# Patient Record
Sex: Male | Born: 1970 | Race: White | Hispanic: No | Marital: Single | Smoking: Never smoker
Health system: Southern US, Community
[De-identification: ages and names within clinical notes are randomized; demographics above are authoritative.]

## PROBLEM LIST (undated history)

## (undated) DIAGNOSIS — F840 Autistic disorder: Secondary | ICD-10-CM

## (undated) DIAGNOSIS — F419 Anxiety disorder, unspecified: Secondary | ICD-10-CM

## (undated) DIAGNOSIS — E039 Hypothyroidism, unspecified: Secondary | ICD-10-CM

## (undated) DIAGNOSIS — G40909 Epilepsy, unspecified, not intractable, without status epilepticus: Secondary | ICD-10-CM

## (undated) DIAGNOSIS — H5210 Myopia, unspecified eye: Secondary | ICD-10-CM

## (undated) DIAGNOSIS — F79 Unspecified intellectual disabilities: Secondary | ICD-10-CM

## (undated) DIAGNOSIS — R0602 Shortness of breath: Secondary | ICD-10-CM

## (undated) DIAGNOSIS — R569 Unspecified convulsions: Secondary | ICD-10-CM

## (undated) DIAGNOSIS — L309 Dermatitis, unspecified: Secondary | ICD-10-CM

## (undated) DIAGNOSIS — K5900 Constipation, unspecified: Secondary | ICD-10-CM

---

## 1999-08-03 ENCOUNTER — Inpatient Hospital Stay (HOSPITAL_COMMUNITY): Admission: EM | Admit: 1999-08-03 | Discharge: 1999-08-06 | Payer: Self-pay

## 1999-08-05 ENCOUNTER — Encounter: Payer: Self-pay | Admitting: Internal Medicine

## 1999-08-06 ENCOUNTER — Encounter: Payer: Self-pay | Admitting: Internal Medicine

## 2000-05-20 ENCOUNTER — Emergency Department (HOSPITAL_COMMUNITY): Admission: EM | Admit: 2000-05-20 | Discharge: 2000-05-21 | Payer: Self-pay | Admitting: Emergency Medicine

## 2003-08-22 ENCOUNTER — Ambulatory Visit (HOSPITAL_COMMUNITY): Admission: RE | Admit: 2003-08-22 | Discharge: 2003-08-22 | Payer: Self-pay | Admitting: Specialist

## 2006-04-02 ENCOUNTER — Emergency Department (HOSPITAL_COMMUNITY): Admission: EM | Admit: 2006-04-02 | Discharge: 2006-04-02 | Payer: Self-pay | Admitting: Emergency Medicine

## 2007-02-27 ENCOUNTER — Ambulatory Visit (HOSPITAL_BASED_OUTPATIENT_CLINIC_OR_DEPARTMENT_OTHER): Admission: RE | Admit: 2007-02-27 | Discharge: 2007-02-27 | Payer: Self-pay | Admitting: Specialist

## 2007-03-05 ENCOUNTER — Ambulatory Visit: Payer: Self-pay | Admitting: Internal Medicine

## 2009-08-07 ENCOUNTER — Ambulatory Visit (HOSPITAL_BASED_OUTPATIENT_CLINIC_OR_DEPARTMENT_OTHER): Admission: RE | Admit: 2009-08-07 | Discharge: 2009-08-07 | Payer: Self-pay | Admitting: Specialist

## 2009-08-07 ENCOUNTER — Ambulatory Visit: Payer: Self-pay | Admitting: Diagnostic Radiology

## 2010-05-09 ENCOUNTER — Encounter: Payer: Self-pay | Admitting: Specialist

## 2010-09-01 NOTE — Procedures (Signed)
NAME:  Philip Vazquez, Philip Vazquez               ACCOUNT NO.:  [id number]   MEDICAL RECORD NO.:  [id number]          PATIENT TYPE:  OUT   LOCATION:  SLEEP CENTER                 FACILITY:  [facility]   PHYSICIAN:  Clinton D. Maple Hudson, MD, FCCP, FACPDATE OF BIRTH:  05/15/68   DATE OF STUDY:  02/27/2007                            NOCTURNAL POLYSOMNOGRAM   REFERRING PHYSICIAN:  Nicolasa Ducking, M.D.   INDICATION FOR STUDY:  Insomnia with sleep apnea.   EPWORTH SLEEPINESS SCORE:  12/24.  BMI 28.8.  Weight 184 pounds, height  67 inches.  Neck 14.5 inches.   MEDICATIONS:  Home medications are listed and reviewed.   SLEEP ARCHITECTURE:  Total sleep time 262 minutes with sleep deficiency  70%.  Stage I was 1%, stage II 69%, stage III 22%.  REM 6% of total  sleep time.  Sleep latency 17 minutes.  REM latency 71 minutes.  Awake  after sleep onset 30 seconds.  Arousal index 2.7.  No bedtime medication  was taken.   RESPIRATORY DATA:  Apnea/hypopnea index (AHI/RDI) 0.2 obstructive events  per hour which is normal (normal range 0-5 per hour).  There was only a  single hypopnea recorded with no sleep related breathing disturbance  otherwise.   OXYGEN DATA:  Mild snoring with oxygen desaturation to a nadir of 90%.  Mean oxygen saturation through the study was 94.4% on room air.   CARDIAC DATA:  Normal sinus rhythm.   MOVEMENT-PARASOMNIA:  The patient pulled on wires.  At 4:26 a.m., he was  described as very agitated and wanting to end the study.  Lights were  turned on at that point.  It appears that the patient actually was awake  from about 3 a.m.   IMPRESSIONS-RECOMMENDATIONS:  Nonspecific waking at about 3 a.m.  Unable  to return to sleep from that point.  If this is his pattern in the home  environment, then consider if he is sleeping excessively during the  daytime.  Some effort to keep him awake during the day and exposed to light and  stimulation may be contusive to sustained sleep for longer  periods of  time.  Sleep was not interrupted by movement or respiratory disturbance.      Clinton D. Maple Hudson, MD, [facility], FACP  Diplomate, Biomedical engineer of Sleep Medicine  Electronically Signed     CDY/MEDQ  D:  03/05/2007 12:33:29  T:  03/06/2007 09:33:44  Job:  [id number]

## 2010-09-04 NOTE — Discharge Summary (Signed)
[facility]  Patient:    Philip Vazquez, Philip Vazquez                      MRN: [id number] Adm. Date:  [id number] Disc. Date: [id number] Attending:  Auburn Bilberry Courson                           Discharge Summary  DATE OF BIRTH:  08-20-1970.  DISCHARGE DIAGNOSIS:  Multiple seizures.  DISCHARGE CONDITION:  Improved.  HISTORY OF PRESENT ILLNESS:  Patient is a 40 year old male with severe mental retardation and seizure disorder who was brought to the emergency room on the 16th because of multiple seizures that day; he had had five seizures at the group home and had several more after arrival in the emergency room.  This patient had apparently been feeling tired prior to the onset of this, but had no other recent illnesses or complaint.  Following a seizure in the emergency room, the patient was very agitated but calmed down with Ativan.  HOSPITAL COURSE:  The patient was loaded with Dilantin in the emergency room and had no further seizures during his hospital stay.  When he woke up enough to begin taking oral medications, again he was placed back on Tegretol, which had been his anticonvulsant prior to admission.  The day following admission, the patient was sleepy but calm without agitation and had not quite woken back up to his baseline.  He was noted to have quite an elevated white count and a low-grade fever and medicine was asked to see the patient to rule out infectious process.  His white count and his fevers steadily came down over the next two days.  No definite pneumonia or other cause for the infection were determined.  It was recommended by medicine that he take Augmentin for 10 days for potential unvisualized pneumonia.  Tegretol level obtained in the emergency room did show that he had a subtherapeutic level and apparently he had missed several doses of Tegretol at his group home in the days prior to the seizures. On the 19th, patient was  discharged back to his group home.  DISCHARGE MEDICATIONS: 1. Tegretol 600 mg in the morning, 400 mg at noon and 600 mg in the evening. 2. Clonazepam 1 mg three times daily. 3. Augmentin 500 mg three times daily for 10 days.  CONDITION ON DISCHARGE:  Patients condition on discharge was stable and back to his usual baseline.  Other than the fever, he had no complications during the hospital stay. DD:  09/04/99 TD:  09/08/99 Job: [id number]

## 2011-04-09 ENCOUNTER — Emergency Department (HOSPITAL_BASED_OUTPATIENT_CLINIC_OR_DEPARTMENT_OTHER)
Admission: EM | Admit: 2011-04-09 | Discharge: 2011-04-09 | Disposition: A | Discharge status: Home / Self Care | Payer: Medicare Other | Attending: Emergency Medicine | Admitting: Emergency Medicine

## 2011-04-09 DIAGNOSIS — IMO0002 Reserved for concepts with insufficient information to code with codable children: Secondary | ICD-10-CM | POA: Insufficient documentation

## 2011-04-09 DIAGNOSIS — T17308A Unspecified foreign body in larynx causing other injury, initial encounter: Secondary | ICD-10-CM | POA: Insufficient documentation

## 2011-04-09 DIAGNOSIS — Y9210 Unspecified residential institution as the place of occurrence of the external cause: Secondary | ICD-10-CM | POA: Insufficient documentation

## 2011-04-09 HISTORY — DX: Unspecified convulsions: R56.9

## 2011-04-09 HISTORY — DX: Unspecified intellectual disabilities: F79

## 2011-04-09 NOTE — ED Notes (Signed)
Caregiver reports pt was at vocational center today, ate a bite of cake and choked.  No obvious airway compromise at present.

## 2011-04-09 NOTE — ED Provider Notes (Signed)
History     CSN: [id number]  Arrival date & time 04/09/11  1405   First MD Initiated Contact with Patient 04/09/11 1501      Chief Complaint  Patient presents with  . Choking    (Consider location/radiation/quality/duration/timing/severity/associated sxs/prior treatment) History provided by: The caregiver. History Limited By: Mental retardation.   patient has a history of MR and was at the vocational training Center today when he was left alone with a large amount of taken front of him which he began to eat vigorously and reported began to choke.  As reported that a Heimlich maneuver was attempted with some regurgitation of cake.  The patient never turned colors became cyanotic.  He is without complaints now.  This event occurred approximately 2 and half hours ago.  He was brought to the emergency department per policy for evaluation  Past Medical History  Diagnosis Date  . Mental retardation   . Seizure     History reviewed. No pertinent past surgical history.  No family history on file.  History  Substance Use Topics  . Smoking status: Never Smoker   . Smokeless tobacco: Never Used  . Alcohol Use: No      Review of Systems  Unable to perform ROS   Allergies  Review of patient's allergies indicates not on file.  Home Medications   Current Outpatient Rx  Name Route Sig Dispense Refill  . CARBAMAZEPINE ER 400 MG PO TB12 Oral Take 400 mg by mouth 3 (three) times daily.      Marland Kitchen CLONAZEPAM 1 MG PO TABS Oral Take 1 mg by mouth 4 (four) times daily.      Marland Kitchen GABAPENTIN 600 MG PO TABS Oral Take 600 mg by mouth 3 (three) times daily.      Marland Kitchen LACTULOSE 10 GM/15ML PO SOLN Oral Take 45 mLs by mouth 2 (two) times daily.      Marland Kitchen LORAZEPAM 2 MG PO TABS Oral Take 2 mg by mouth as needed.        BP 110/69  Pulse 82  Resp 18  Wt 192 lb (87.091 kg)  SpO2 96%  Physical Exam  Nursing note and vitals reviewed. Constitutional: He is oriented to person, place, and time. He appears  well-developed and well-nourished.  HENT:  Head: Normocephalic and atraumatic.  Eyes: EOM are normal.  Neck: Neck supple.  Cardiovascular: Normal rate and regular rhythm.   Pulmonary/Chest: Effort normal and breath sounds normal. No respiratory distress. He has no wheezes. He has no rales.  Abdominal: Soft.  Musculoskeletal: Normal range of motion.  Neurological: He is alert and oriented to person, place, and time.  Skin: Skin is warm and dry.    ED Course  Procedures (including critical care time)  Labs Reviewed - No data to display No results found.   1. Choking       MDM  The patient had no color change.  His O2 saturations are 96% on room air.  He has no increased work of breathing.  There is no indication for x-ray.  He's been discharged home with strict instructions to return for fever greater than 101 or worsening breathing or development of persistent cough or with changes in mental status.        Lyanne Co, MD 04/09/11 [phone]

## 2012-10-28 ENCOUNTER — Encounter (HOSPITAL_COMMUNITY): Payer: Self-pay | Admitting: *Deleted

## 2012-10-28 ENCOUNTER — Emergency Department (INDEPENDENT_AMBULATORY_CARE_PROVIDER_SITE_OTHER)
Admission: EM | Admit: 2012-10-28 | Discharge: 2012-10-28 | Disposition: A | Discharge status: Home / Self Care | Payer: Medicare Other | Source: Home / Self Care | Attending: Family Medicine | Admitting: Family Medicine

## 2012-10-28 DIAGNOSIS — S90129A Contusion of unspecified lesser toe(s) without damage to nail, initial encounter: Secondary | ICD-10-CM

## 2012-10-28 DIAGNOSIS — S90229A Contusion of unspecified lesser toe(s) with damage to nail, initial encounter: Secondary | ICD-10-CM

## 2012-10-28 HISTORY — DX: Epilepsy, unspecified, not intractable, without status epilepticus: G40.909

## 2012-10-28 HISTORY — DX: Autistic disorder: F84.0

## 2012-10-28 NOTE — ED Provider Notes (Signed)
History    CSN: [id number] Arrival date & time 10/28/12  1107  First MD Initiated Contact with Patient 10/28/12 1121     Chief Complaint  Patient presents with  . Nail Problem   (Consider location/radiation/quality/duration/timing/severity/associated sxs/prior Treatment) HPI Comments: 42 year old male non diawith history of mental retardation and autism disorder who lives in a group home. Was noticed to have discolored right big toenail first noticed yesterday afternoon. No known injury. Otherwise patient has shown usual behavior without changes, baseline energy level and appetite. No limping.  Past Medical History  Diagnosis Date  . Mental retardation   . Autism   . Seizure   . Seizure disorder    History reviewed. No pertinent past surgical history. No family history on file. History  Substance Use Topics  . Smoking status: Never Smoker   . Smokeless tobacco: Never Used  . Alcohol Use: No    Review of Systems  Constitutional: Negative for fever, chills, activity change and appetite change.  Skin:       Toe nail discoloration as per HPI  Psychiatric/Behavioral:       No changes in behavior from base line    Allergies  Bee venom  Home Medications   Current Outpatient Rx  Name  Route  Sig  Dispense  Refill  . carbamazepine (TEGRETOL XR) 400 MG 12 hr tablet   Oral   Take 400 mg by mouth 3 (three) times daily.           . Chloral Hydrate (SOMNOTE PO)   Oral   Take by mouth.         . clonazePAM (KLONOPIN) 1 MG tablet   Oral   Take 0.5 mg by mouth 4 (four) times daily.          Marland Kitchen erythromycin (ERYTHROCIN) 250 MG tablet   Oral   Take 250 mg by mouth 2 (two) times daily.         Marland Kitchen gabapentin (NEURONTIN) 600 MG tablet   Oral   Take 600 mg by mouth 3 (three) times daily.           Marland Kitchen lactulose (CHRONULAC) 10 GM/15ML solution   Oral   Take 45 mLs by mouth 2 (two) times daily.           Marland Kitchen oral electrolytes (THERMOTABS) TABS   Oral   Take by  mouth.         Marland Kitchen LORazepam (ATIVAN) 2 MG tablet   Oral   Take 2 mg by mouth as needed.            Pulse 90  SpO2 97% Physical Exam  Nursing note and vitals reviewed. Constitutional: He is oriented to person, place, and time. He appears well-developed and well-nourished. No distress.  Singing, alert, speaks loud, at base line behavior as per care taker, no obvious distress.   HENT:  Head: Normocephalic and atraumatic.  Cardiovascular: Normal heart sounds.   Pulmonary/Chest: Breath sounds normal.  Musculoskeletal: He exhibits no edema and no tenderness.  No bruising, skin cuts or deformity of the first proximal phalange of toes.   Neurological: He is alert and oriented to person, place, and time.  Skin: He is not diaphoretic.  Right toe nail: long not trimmed toe nails. there is a less than 40% subungual hematoma mostly from the lateral half of the toe nail appears partially drained as there is dry blood in the tip of the toe below the nail laterally. No avulsion.  No paronychia.  (similar milder changes in left toenail) Patient is wearing narrow closed toe shoes here that obviously have friction with long big toenails.     ED Course  Procedures (including critical care time) Labs Reviewed - No data to display No results found. 1. Subungual hematoma of toenail, unspecified laterality, initial encounter     MDM  Small subungueal hematoma of the right toenail, milder similar changes in left toenail. On the Right toenail has been self draining as there is blood in the tip of toe under the nail.  Reccommended foot soak to complete draining and instructed to let dry feet well and use open toes shoes or sandals. Needs toe nail trimming by a podiatrist, referal provided.   Was placed on bilateral post op open toe shoes.  Supportive care and red flags that should prompt return to medical attention discussed with care taker and provided in writing.   Sharin Grave, MD 10/30/12  [phone]

## 2012-10-28 NOTE — ED Notes (Signed)
Per caregiver, report at group home stated they noticed purplish discoloration to right great toe nailbed, and small spot to left great toe nailbed yesterday afternoon.  No known injury.  Pt walking normal per caregiver.  Unable to obtain blood pressure and temperature due to pt behavior.

## 2012-11-17 ENCOUNTER — Ambulatory Visit (INDEPENDENT_AMBULATORY_CARE_PROVIDER_SITE_OTHER): Payer: Medicare Other | Admitting: Podiatry

## 2012-11-17 DIAGNOSIS — L608 Other nail disorders: Secondary | ICD-10-CM

## 2012-11-17 NOTE — Progress Notes (Signed)
Presents with blackend toe nail on right. Patient is non-verbal patient. Came in with a care taker. Care taker noticed a black toe nail on right a week ago. Patient did not express any pain from the toe.  Objective: Blackened toe nail from old injury right great toe. No edema or erythema noted. Rectus foot without gross deformity. All pedal pulses are palpable.  Assessment: Old nail injury with blackened right great toe nail without soft tissue inflammation or infection.  Plan:  Debrided discolored nail. Return as needed.

## 2012-11-28 DIAGNOSIS — L608 Other nail disorders: Secondary | ICD-10-CM | POA: Insufficient documentation

## 2013-02-03 ENCOUNTER — Emergency Department (HOSPITAL_COMMUNITY): Payer: Medicare Other

## 2013-02-03 ENCOUNTER — Emergency Department (HOSPITAL_COMMUNITY)
Admission: EM | Admit: 2013-02-03 | Discharge: 2013-02-03 | Disposition: A | Discharge status: Home / Self Care | Payer: Medicare Other | Attending: Emergency Medicine | Admitting: Emergency Medicine

## 2013-02-03 ENCOUNTER — Encounter (HOSPITAL_COMMUNITY): Payer: Self-pay | Admitting: Emergency Medicine

## 2013-02-03 DIAGNOSIS — G40909 Epilepsy, unspecified, not intractable, without status epilepticus: Secondary | ICD-10-CM | POA: Insufficient documentation

## 2013-02-03 DIAGNOSIS — Z79899 Other long term (current) drug therapy: Secondary | ICD-10-CM | POA: Insufficient documentation

## 2013-02-03 DIAGNOSIS — R569 Unspecified convulsions: Secondary | ICD-10-CM

## 2013-02-03 DIAGNOSIS — F840 Autistic disorder: Secondary | ICD-10-CM | POA: Insufficient documentation

## 2013-02-03 LAB — URINALYSIS, ROUTINE W REFLEX MICROSCOPIC
Bilirubin Urine: NEGATIVE
Ketones, ur: 15 mg/dL — AB
Nitrite: NEGATIVE
Protein, ur: 30 mg/dL — AB
Urobilinogen, UA: 0.2 mg/dL (ref 0.0–1.0)

## 2013-02-03 LAB — BASIC METABOLIC PANEL
BUN: 8 mg/dL (ref 6–23)
Chloride: 97 mEq/L (ref 96–112)
Creatinine, Ser: 0.81 mg/dL (ref 0.50–1.35)
Glucose, Bld: 137 mg/dL — ABNORMAL HIGH (ref 70–99)
Potassium: 3.8 mEq/L (ref 3.5–5.1)

## 2013-02-03 LAB — RAPID URINE DRUG SCREEN, HOSP PERFORMED
Barbiturates: NOT DETECTED
Benzodiazepines: POSITIVE — AB
Cocaine: NOT DETECTED
Tetrahydrocannabinol: NOT DETECTED

## 2013-02-03 LAB — CBC
HCT: 43.8 % (ref 39.0–52.0)
Hemoglobin: 15.8 g/dL (ref 13.0–17.0)
MCHC: 36.1 g/dL — ABNORMAL HIGH (ref 30.0–36.0)
MCV: 86.7 fL (ref 78.0–100.0)
WBC: 14 10*3/uL — ABNORMAL HIGH (ref 4.0–10.5)

## 2013-02-03 LAB — URINE MICROSCOPIC-ADD ON

## 2013-02-03 MED ORDER — LORAZEPAM 2 MG/ML IJ SOLN
2.0000 mg | Freq: Once | INTRAMUSCULAR | Status: AC
  Administered 2013-02-03: 2 mg via INTRAVENOUS
  Filled 2013-02-03: qty 1

## 2013-02-03 MED ORDER — HALOPERIDOL LACTATE 5 MG/ML IJ SOLN
5.0000 mg | Freq: Once | INTRAMUSCULAR | Status: AC
  Administered 2013-02-03: 5 mg via INTRAVENOUS
  Filled 2013-02-03: qty 1

## 2013-02-03 MED ORDER — SODIUM CHLORIDE 0.9 % IV BOLUS (SEPSIS)
1000.0000 mL | Freq: Once | INTRAVENOUS | Status: AC
  Administered 2013-02-03: 1000 mL via INTRAVENOUS

## 2013-02-03 MED ORDER — GABAPENTIN 800 MG PO TABS: 800.0000 mg | ORAL_TABLET | Freq: Three times a day (TID) | ORAL | Status: AC

## 2013-02-03 MED ORDER — LORAZEPAM 2 MG/ML IJ SOLN
INTRAMUSCULAR | Status: AC
  Administered 2013-02-03: 2 mg
  Filled 2013-02-03: qty 1

## 2013-02-03 NOTE — ED Notes (Signed)
Presents from Group home, with seizure tonic clonic, mentally handicapped, given 10 mg versed, pt combative, non verbal. Tachycardic, diaphoretic, pale.

## 2013-02-03 NOTE — ED Provider Notes (Signed)
CSN: [id number]     Arrival date & time 02/03/13  1713 History   First MD Initiated Contact with Patient 02/03/13 1714     Chief Complaint  Patient presents with  . Seizures   (Consider location/radiation/quality/duration/timing/severity/associated sxs/prior Treatment) Patient is a 42 y.o. male presenting with seizures. The history is provided by the patient.  Seizures Seizure activity on arrival: no   Seizure type:  Grand mal Preceding symptoms: no sensation of an aura present   Initial focality:  None Episode characteristics: combativeness and generalized shaking   Episode characteristics: no confusion and no disorientation   Postictal symptoms: no confusion   Return to baseline: yes   Severity:  Moderate Duration:  2 minutes Timing:  Clustered Number of seizures this episode:  3 today Progression:  Worsening Context: not change in medication, not sleeping less, not drug use, medical compliance and not previous head injury   Recent head injury:  No recent head injuries PTA treatment:  Diazepam History of seizures: yes     Past Medical History  Diagnosis Date  . Mental retardation   . Autism   . Seizure   . Seizure disorder    History reviewed. No pertinent past surgical history. History reviewed. No pertinent family history. History  Substance Use Topics  . Smoking status: Never Smoker   . Smokeless tobacco: Never Used  . Alcohol Use: No    Review of Systems  Constitutional: Negative for fever.  Respiratory: Negative for cough and shortness of breath.   Gastrointestinal: Negative for vomiting and abdominal pain.  Neurological: Positive for seizures.  All other systems reviewed and are negative.    Allergies  Bee venom  Home Medications   Current Outpatient Rx  Name  Route  Sig  Dispense  Refill  . carbamazepine (TEGRETOL XR) 400 MG 12 hr tablet   Oral   Take 400 mg by mouth 3 (three) times daily.           . Chloral Hydrate (SOMNOTE PO)   Oral    Take 1 tablet by mouth daily.          . clonazePAM (KLONOPIN) 1 MG tablet   Oral   Take 0.5 mg by mouth 4 (four) times daily.          Marland Kitchen lactulose (CHRONULAC) 10 GM/15ML solution   Oral   Take 45 mLs by mouth 2 (two) times daily.           Marland Kitchen LORazepam (ATIVAN) 2 MG tablet   Oral   Take 2 mg by mouth every 6 (six) hours as needed for anxiety.          . gabapentin (NEURONTIN) 800 MG tablet   Oral   Take 1 tablet (800 mg total) by mouth 3 (three) times daily.   90 tablet   0    BP 124/79  Pulse 90  Temp(Src) 98.5 F (36.9 C) (Oral)  Resp 18  SpO2 95% Physical Exam  Nursing note and vitals reviewed. Constitutional: He appears well-developed and well-nourished. No distress.  HENT:  Head: Normocephalic and atraumatic.  Mouth/Throat: No oropharyngeal exudate.  Eyes: EOM are normal. Pupils are equal, round, and reactive to light.  Neck: Normal range of motion. Neck supple.  Cardiovascular: Normal rate and regular rhythm.  Exam reveals no friction rub.   No murmur heard. Pulmonary/Chest: Effort normal and breath sounds normal. No respiratory distress. He has no wheezes. He has no rales.  Abdominal: He exhibits no distension.  There is no tenderness. There is no rebound.  Musculoskeletal: Normal range of motion. He exhibits no edema.  Neurological: He is alert. No cranial nerve deficit. He exhibits normal muscle tone. Coordination normal.  Skin: He is not diaphoretic.    ED Course  Procedures (including critical care time) Labs Review Labs Reviewed  CBC - Abnormal; Notable for the following:    WBC 14.0 (*)    MCHC 36.1 (*)    All other components within normal limits  BASIC METABOLIC PANEL - Abnormal; Notable for the following:    Glucose, Bld 137 (*)    All other components within normal limits  URINE RAPID DRUG SCREEN (HOSP PERFORMED) - Abnormal; Notable for the following:    Benzodiazepines POSITIVE (*)    All other components within normal limits   URINALYSIS, ROUTINE W REFLEX MICROSCOPIC - Abnormal; Notable for the following:    APPearance CLOUDY (*)    Hgb urine dipstick SMALL (*)    Ketones, ur 15 (*)    Protein, ur 30 (*)    All other components within normal limits  CARBAMAZEPINE LEVEL, TOTAL  URINE MICROSCOPIC-ADD ON   Imaging Review Ct Head Wo Contrast  02/03/2013   CLINICAL DATA:  Seizure  EXAM: CT HEAD WITHOUT CONTRAST  TECHNIQUE: Contiguous axial images were obtained from the base of the skull through the vertex without intravenous contrast.  COMPARISON:  None.  FINDINGS: No mass effect, midline shift, or acute intracranial hemorrhage. Brain parenchyma and ventricular system are within normal limits. Mastoid air cells are clear. Visualized paranasal sinuses are clear. Cranium is intact.  IMPRESSION: No acute intracranial pathology.   Electronically Signed   By: Maryclare Bean M.D.   On: 02/03/2013 19:34   Dg Chest Port 1 View  02/03/2013   CLINICAL DATA:  Agitation  EXAM: PORTABLE CHEST - 1 VIEW  COMPARISON:  None.  FINDINGS: Low lung volumes. Bibasilar linear atelectasis. Normal heart size. No pneumothorax.  IMPRESSION: Bibasilar atelectasis   Electronically Signed   By: Maryclare Bean M.D.   On: 02/03/2013 19:04    EKG Interpretation   None       MDM   1. Seizure    42M nonverbal, hx of seizures. Had 3 seizures today, one yesterday. Has allegedly not had seizures since July. Patient lives at [facility] group home. Per staff there, no missed medications. Combative with EMS, had normal sugar, required full restraints for transport. Upon arrival, combative, trying to get off the bed, talking loudly. Given ativan and haldol. Received 10 IM of Versed en route.  Moving all extremities, no signs of trauma, will check labs.  Labs show mild leukocytosis, otherwise normal. CT Head normal. No signs of infection. I spoke with Neuro who recommended increase in gabapentin. Patient relaxing, sleeping comfortably here. Stable for  discharge, instructed patient's caregiver to f/u in 2 days with PCP.     Dagmar Hait, MD 02/03/13 2009

## 2013-02-28 ENCOUNTER — Other Ambulatory Visit: Payer: Self-pay | Admitting: Dentistry

## 2013-03-01 ENCOUNTER — Encounter (HOSPITAL_COMMUNITY)
Admission: RE | Admit: 2013-03-01 | Discharge: 2013-03-01 | Disposition: A | Discharge status: Home / Self Care | Payer: Medicare Other | Source: Ambulatory Visit | Attending: Dentistry | Admitting: Dentistry

## 2013-03-01 ENCOUNTER — Encounter (HOSPITAL_COMMUNITY): Payer: Self-pay

## 2013-03-01 HISTORY — DX: Dermatitis, unspecified: L30.9

## 2013-03-01 HISTORY — DX: Myopia, unspecified eye: H52.10

## 2013-03-01 HISTORY — DX: Shortness of breath: R06.02

## 2013-03-01 LAB — BASIC METABOLIC PANEL
CO2: 25 mEq/L (ref 19–32)
Calcium: 9 mg/dL (ref 8.4–10.5)
Creatinine, Ser: 0.7 mg/dL (ref 0.50–1.35)
GFR calc Af Amer: >90 mL/min (ref >90)
GFR calc non Af Amer: >90 mL/min (ref >90)
Potassium: 3.7 mEq/L (ref 3.5–5.1)
Sodium: 132 mEq/L — ABNORMAL LOW (ref 135–145)

## 2013-03-01 LAB — CBC
Hemoglobin: 15.5 g/dL (ref 13.0–17.0)
MCV: 86.7 fL (ref 78.0–100.0)
Platelets: 428 10*3/uL — ABNORMAL HIGH (ref 150–400)
RBC: 5.02 MIL/uL (ref 4.22–5.81)
WBC: 11.6 10*3/uL — ABNORMAL HIGH (ref 4.0–10.5)

## 2013-03-01 NOTE — Pre-Procedure Instructions (Signed)
Philip Vazquez  03/01/2013   Your procedure is scheduled on:  03/02/13  Report to [facility]  2 * 3 at 830 AM.  Call this number if you have problems the morning of surgery: [phone]   Remember:   Do not eat food or drink liquids after midnight.   Take these medicines the morning of surgery with A SIP OF WATER: TEGRETOL,SOMNOTE,KLONOPIN,NEURONTIN,ATIVAN   Do not wear jewelry, make-up or nail polish.  Do not wear lotions, powders, or perfumes. You may wear deodorant.  Do not shave 48 hours prior to surgery. Men may shave face and neck.  Do not bring valuables to the hospital.  [facility] is not responsible                  for any belongings or valuables.               Contacts, dentures or bridgework may not be worn into surgery.  Leave suitcase in the car. After surgery it may be brought to your room.  For patients admitted to the hospital, discharge time is determined by your                treatment team.               Patients discharged the day of surgery will not be allowed to drive  home.  Name and phone number of your driver: FAMILY  Special Instructions: Shower using CHG 2 nights before surgery and the night before surgery.  If you shower the day of surgery use CHG.  Use special wash - you have one bottle of CHG for all showers.  You should use approximately 1/3 of the bottle for each shower.   Please read over the following fact sheets that you were given: Pain Booklet, Coughing and Deep Breathing and Surgical Site Infection Prevention

## 2013-03-02 ENCOUNTER — Ambulatory Visit (HOSPITAL_COMMUNITY): Payer: Medicare Other | Admitting: Anesthesiology

## 2013-03-02 ENCOUNTER — Encounter (HOSPITAL_COMMUNITY)
Admission: RE | Disposition: A | Discharge status: Home / Self Care | Payer: Self-pay | Source: Ambulatory Visit | Attending: Dentistry

## 2013-03-02 ENCOUNTER — Encounter (HOSPITAL_COMMUNITY): Payer: Self-pay | Admitting: *Deleted

## 2013-03-02 ENCOUNTER — Ambulatory Visit (HOSPITAL_COMMUNITY)
Admission: RE | Admit: 2013-03-02 | Discharge: 2013-03-02 | Disposition: A | Discharge status: Home / Self Care | Payer: Medicare Other | Source: Ambulatory Visit | Attending: Dentistry | Admitting: Dentistry

## 2013-03-02 ENCOUNTER — Encounter (HOSPITAL_COMMUNITY): Payer: Medicare Other | Admitting: Anesthesiology

## 2013-03-02 DIAGNOSIS — F919 Conduct disorder, unspecified: Secondary | ICD-10-CM | POA: Insufficient documentation

## 2013-03-02 DIAGNOSIS — K029 Dental caries, unspecified: Secondary | ICD-10-CM | POA: Insufficient documentation

## 2013-03-02 DIAGNOSIS — F79 Unspecified intellectual disabilities: Secondary | ICD-10-CM | POA: Insufficient documentation

## 2013-03-02 HISTORY — PX: DENTAL RESTORATION/EXTRACTION WITH X-RAY: SHX5796

## 2013-03-02 SURGERY — DENTAL RESTORATION/EXTRACTION WITH X-RAY
Anesthesia: General | Site: Mouth | Wound class: Clean Contaminated

## 2013-03-02 MED ORDER — CEFAZOLIN SODIUM-DEXTROSE 2-3 GM-% IV SOLR
1.0000 g | Freq: Once | INTRAVENOUS | Status: AC
  Administered 2013-03-02: 1 g via INTRAVENOUS

## 2013-03-02 MED ORDER — KETAMINE HCL 10 MG/ML IJ SOLN
INTRAMUSCULAR | Status: DC | PRN
  Administered 2013-03-02: 400 mg via INTRAVENOUS

## 2013-03-02 MED ORDER — KETAMINE HCL 100 MG/ML IJ SOLN
INTRAMUSCULAR | Status: AC
  Filled 2013-03-02: qty 1

## 2013-03-02 MED ORDER — ONDANSETRON HCL 4 MG/2ML IJ SOLN: 4.0000 mg | Freq: Once | INTRAMUSCULAR | Status: DC | PRN

## 2013-03-02 MED ORDER — LIDOCAINE-EPINEPHRINE 2 %-1:100000 IJ SOLN
INTRAMUSCULAR | Status: DC | PRN
  Administered 2013-03-02: 6 mL

## 2013-03-02 MED ORDER — HEMOSTATIC AGENTS (NO CHARGE) OPTIME
TOPICAL | Status: DC | PRN
Start: 2013-03-02 — End: 2013-03-02
  Administered 2013-03-02: 1 via TOPICAL

## 2013-03-02 MED ORDER — OXYMETAZOLINE HCL 0.05 % NA SOLN
NASAL | Status: DC | PRN
  Administered 2013-03-02: 1 via NASAL

## 2013-03-02 MED ORDER — EPHEDRINE SULFATE 50 MG/ML IJ SOLN
INTRAMUSCULAR | Status: DC | PRN
  Administered 2013-03-02: 10 mg via INTRAVENOUS

## 2013-03-02 MED ORDER — CHLORHEXIDINE GLUCONATE 0.12 % MT SOLN
OROMUCOSAL | Status: DC | PRN
  Administered 2013-03-02: 15 mL via OROMUCOSAL

## 2013-03-02 MED ORDER — PHENYLEPHRINE HCL 10 MG/ML IJ SOLN
INTRAMUSCULAR | Status: DC | PRN
  Administered 2013-03-02 (×5): 80 ug via INTRAVENOUS

## 2013-03-02 MED ORDER — CEFAZOLIN SODIUM 1-5 GM-% IV SOLN
INTRAVENOUS | Status: AC
  Filled 2013-03-02: qty 50

## 2013-03-02 MED ORDER — ROCURONIUM BROMIDE 100 MG/10ML IV SOLN
INTRAVENOUS | Status: DC | PRN
  Administered 2013-03-02: 40 mg via INTRAVENOUS

## 2013-03-02 MED ORDER — KETOROLAC TROMETHAMINE 30 MG/ML IJ SOLN
30.0000 mg | Freq: Once | INTRAMUSCULAR | Status: AC
  Administered 2013-03-02: 30 mg via INTRAVENOUS
  Filled 2013-03-02: qty 1

## 2013-03-02 MED ORDER — GLYCOPYRROLATE 0.2 MG/ML IJ SOLN
INTRAMUSCULAR | Status: DC | PRN
  Administered 2013-03-02: 0.4 mg via INTRAVENOUS

## 2013-03-02 MED ORDER — ONDANSETRON HCL 4 MG/2ML IJ SOLN
INTRAMUSCULAR | Status: DC | PRN
  Administered 2013-03-02: 4 mg via INTRAVENOUS

## 2013-03-02 MED ORDER — NEOSTIGMINE METHYLSULFATE 1 MG/ML IJ SOLN
INTRAMUSCULAR | Status: DC | PRN
  Administered 2013-03-02: 3 mg via INTRAVENOUS

## 2013-03-02 MED ORDER — HYDROMORPHONE HCL PF 1 MG/ML IJ SOLN: 0.2500 mg | INTRAMUSCULAR | Status: DC | PRN

## 2013-03-02 MED ORDER — LACTATED RINGERS IV SOLN
INTRAVENOUS | Status: DC
  Administered 2013-03-02: 10:00:00 via INTRAVENOUS

## 2013-03-02 MED ORDER — CHLORHEXIDINE GLUCONATE 0.12 % MT SOLN
15.0000 mL | Freq: Once | OROMUCOSAL | Status: DC
  Filled 2013-03-02: qty 15

## 2013-03-02 MED ORDER — FENTANYL CITRATE 0.05 MG/ML IJ SOLN
INTRAMUSCULAR | Status: DC | PRN
Start: 2013-03-02 — End: 2013-03-02
  Administered 2013-03-02: 75 ug via INTRAVENOUS

## 2013-03-02 SURGICAL SUPPLY — 22 items
CANISTER SUCTION 2500CC (MISCELLANEOUS) ×2 IMPLANT
COVER PROBE W GEL 5X96 (DRAPES) ×2 IMPLANT
COVER SURGICAL LIGHT HANDLE (MISCELLANEOUS) ×2 IMPLANT
COVER TABLE BACK 60X90 (DRAPES) ×2 IMPLANT
DRAPE PROXIMA HALF (DRAPES) ×2 IMPLANT
GAUZE SPONGE 4X4 16PLY XRAY LF (GAUZE/BANDAGES/DRESSINGS) ×2 IMPLANT
GLOVE BIO SURGEON STRL SZ7.5 (GLOVE) ×2 IMPLANT
GOWN STRL NON-REIN LRG LVL3 (GOWN DISPOSABLE) ×4 IMPLANT
KIT BASIN OR (CUSTOM PROCEDURE TRAY) ×2 IMPLANT
KIT ROOM TURNOVER OR (KITS) ×2 IMPLANT
NEEDLE 27GAX1X1/2 (NEEDLE) ×2 IMPLANT
NEEDLE FILTER BLUNT 18X 1/2SAF (NEEDLE) ×1
NEEDLE FILTER BLUNT 18X1 1/2 (NEEDLE) ×1 IMPLANT
PAD ARMBOARD 7.5X6 YLW CONV (MISCELLANEOUS) ×4 IMPLANT
SPONGE SURGIFOAM ABS GEL SZ50 (HEMOSTASIS) ×2 IMPLANT
SUT CHROMIC 3 0 PS 2 (SUTURE) ×2 IMPLANT
SYR CONTROL 10ML LL (SYRINGE) ×2 IMPLANT
TOOTHBRUSH ADULT (PERSONAL CARE ITEMS) ×2 IMPLANT
TOWEL OR 17X26 10 PK STRL BLUE (TOWEL DISPOSABLE) ×2 IMPLANT
TUBE CONNECTING 12X1/4 (SUCTIONS) ×2 IMPLANT
WATER STERILE IRR 1000ML POUR (IV SOLUTION) ×2 IMPLANT
YANKAUER SUCT BULB TIP NO VENT (SUCTIONS) ×2 IMPLANT

## 2013-03-02 NOTE — Anesthesia Postprocedure Evaluation (Signed)
  Anesthesia Post-op Note  Patient: Philip Vazquez  Procedure(s) Performed: Procedure(s): RECALL DENTAL EXAMINATION AND CLEANING; FULL MOUTH X-RAYS' FULL MOUTH DEBRIDEMENT; EXTRACTION OF TEETH #1, 16, 30; COMPOSITE FILLINGS OF TEETH #7, 8, 9, 10 (N/A)  Patient Location: PACU  Anesthesia Type:General  Level of Consciousness: awake, alert  and oriented  Airway and Oxygen Therapy: Patient Spontanous Breathing and Patient connected to nasal cannula oxygen  Post-op Pain: mild  Post-op Assessment: Post-op Vital signs reviewed, Patient's Cardiovascular Status Stable, Respiratory Function Stable, Patent Airway and Pain level controlled  Post-op Vital Signs: stable  Complications: No apparent anesthesia complications

## 2013-03-02 NOTE — Anesthesia Preprocedure Evaluation (Signed)
Anesthesia Evaluation  Patient identified by MRN, date of birth, ID band Patient awake    Reviewed: Allergy & Precautions, H&P , NPO status , Patient's Chart, lab work & pertinent test results  Airway       Dental   Pulmonary  breath sounds clear to auscultation        Cardiovascular Rhythm:Regular     Neuro/Psych    GI/Hepatic   Endo/Other    Renal/GU      Musculoskeletal   Abdominal   Peds  Hematology   Anesthesia Other Findings   Reproductive/Obstetrics                           Anesthesia Physical Anesthesia Plan  ASA: III  Anesthesia Plan: General   Post-op Pain Management:    Induction: Intravenous  Airway Management Planned:   Additional Equipment:   Intra-op Plan:   Post-operative Plan:   Informed Consent: I have reviewed the patients History and Physical, chart, labs and discussed the procedure including the risks, benefits and alternatives for the proposed anesthesia with the patient or authorized representative who has indicated his/her understanding and acceptance.     Plan Discussed with: CRNA  Anesthesia Plan Comments: (Severe autism and developmental delay Seizure disorder, last seizure approximately one month ago  Plan GA  Kipp Brood, MD)        Anesthesia Quick Evaluation

## 2013-03-02 NOTE — Op Note (Signed)
Recall Exam, FMX, FMD, Extractions #1,16,30 with 6cc 2% Lidocaine 1:100,000 epi, gel foam, 3-0 chromic suture, facial composites #7,8,9,10 30 Mg Toradol, 1 gr Ancef given during surgery

## 2013-03-02 NOTE — Brief Op Note (Signed)
03/02/2013  11:45 AM  PATIENT:  Gaspar Skeeters  42 y.o. male  PRE-OPERATIVE DIAGNOSIS:  DENTAL CARIES  POST-OPERATIVE DIAGNOSIS:  DENTAL CARIES  PROCEDURE:  Procedure(s): RECALL DENTAL EXAMINATION AND CLEANING; FULL MOUTH X-RAYS' FULL MOUTH DEBRIDEMENT; EXTRACTION OF TEETH #1, 16, 30; COMPOSITE FILLINGS OF TEETH #7, 8, 9, 10 (N/A)  SURGEON:  Surgeon(s) and Role:    * Esaw Dace., DDS - Primary  PHYSICIAN ASSISTANT:   ASSISTANTS: Laqueta Carina   ANESTHESIA:   general  EBL:  Total I/O In: 500 [I.V.:500] Out: 25 [Blood:25]  BLOOD ADMINISTERED:none  DRAINS: none   LOCAL MEDICATIONS USED:  LIDOCAINE   SPECIMEN:  No Specimen  DISPOSITION OF SPECIMEN:  N/A  COUNTS:  YES  TOURNIQUET:  * No tourniquets in log *  DICTATION: .Note written in paper chart  PLAN OF CARE: Discharge to home after PACU  PATIENT DISPOSITION:  PACU - hemodynamically stable.   Delay start of Pharmacological VTE agent (>24hrs) due to surgical blood loss or risk of bleeding: not applicable

## 2013-03-02 NOTE — H&P (Signed)
No changes in health history 

## 2013-03-02 NOTE — Transfer of Care (Signed)
Immediate Anesthesia Transfer of Care Note  Patient: Philip Vazquez  Procedure(s) Performed: Procedure(s): RECALL DENTAL EXAMINATION AND CLEANING; FULL MOUTH X-RAYS' FULL MOUTH DEBRIDEMENT; EXTRACTION OF TEETH #1, 16, 30; COMPOSITE FILLINGS OF TEETH #7, 8, 9, 10 (N/A)  Patient Location: PACU  Anesthesia Type:General  Level of Consciousness: sedated  Airway & Oxygen Therapy: Patient Spontanous Breathing and Patient connected to face mask oxygen  Post-op Assessment: Report given to PACU RN and Post -op Vital signs reviewed and stable  Post vital signs: Reviewed and stable  Complications: No apparent anesthesia complications

## 2013-03-02 NOTE — Progress Notes (Signed)
Spoke with Dr Noreene Larsson about  Starting iv, states to wait he will assess and maybe try himself.

## 2013-03-02 NOTE — Anesthesia Procedure Notes (Signed)
Procedure Name: Intubation Date/Time: 03/02/2013 10:11 AM Performed by: Quentin Ore Pre-anesthesia Checklist: Patient identified, Emergency Drugs available, Suction available, Patient being monitored and Timeout performed Patient Re-evaluated:Patient Re-evaluated prior to inductionOxygen Delivery Method: Circle system utilized Preoxygenation: Pre-oxygenation with 100% oxygen Intubation Type: IV induction and Inhalational induction Ventilation: Mask ventilation without difficulty and Oral airway inserted - appropriate to patient size Laryngoscope Size: Mac and 4 Grade View: Grade I Nasal Tubes: Left, Nasal prep performed, Magill forceps- large, utilized and Nasal Rae Tube size: 7.0 mm Number of attempts: 1 Placement Confirmation: ETT inserted through vocal cords under direct vision,  positive ETCO2 and breath sounds checked- equal and bilateral Tube secured with: Tape Dental Injury: Teeth and Oropharynx as per pre-operative assessment

## 2013-03-03 NOTE — Op Note (Signed)
NAMEDail, Philip Vazquez               ACCOUNT NO.:  [id number]  MEDICAL RECORD NO.:  [id number]  LOCATION:  [facility]                         FACILITY:  [facility]  PHYSICIAN:  Esaw Dace., D.D.S.DATE OF BIRTH:  1970-09-24  DATE OF PROCEDURE: DATE OF DISCHARGE:  03/02/2013                              OPERATIVE REPORT   PREOPERATIVE DIAGNOSIS:  Dental caries/behavior management issues due to intellectual/developmental disabilities.  Dental care provided in the OR for medically necessary treatment.  OPERATIONS:  Full mouth oral rehabilitation including exam, x-rays, cleaning, extractions.  OPERATIVE SURGEON:  Azucena Freed, D.D.S.  ASSISTANT:  Laqueta Carina and hospital staff.  ANESTHESIA:  General.  DESCRIPTION OF PROCEDURE:  The patient was brought into the operating room and placed in the supine position.  General anesthesia was administered via nasal intubation.  The patient was prepped and draped in the usual manner for an intraoral general dentistry procedure. Oropharynx was suctioned and a moistened posterior throat pack was placed.  Full mouth oral rehabilitation including all hard and soft tissues were performed.  This was a recall exam.  Soft tissue exam reveals floor of the mouth, buccal mucosa, soft palate, hard palate, tongue, gingival, and frenum attachments all within normal limits.  With regard to size, color, and consistency, hard tissue exam reveals #1 through 32 present with dental caries on #1, 16, and root tip on #30. Full mouth series of digital x-rays was taken and a full mouth debridement was completed.  Operative care was provided with a high- speed handpiece #4 bur and a slow speed handpiece #4 bur. Facial composites were completed on #7, 8, 9, and 10. Extractions were completed on 1, 16, 30.  Gelfoam was inserted into the extraction sites.  A 3-0 chromic suture was used to close the extraction sites and 6 mL of 2% lidocaine with 1:100,000  epinephrine was given and estimated blood loss of 30 mL.  Surgery drugs used 30 mg Toradol and 1 g of Ancef.  The mouth was suctioned dry and a posterior throat pack was carefully removed with constant suction.  The patient was awakened in the OR, and transferred to the recovery room in good condition.  An extraction sheet was given to the patient's representative.  Post prescriptions given Vicodin 5 mg/325 mg 12 tabs 1 q.4 hours p.r.n. pain, amoxicillin 500 mg 1 t.i.d. x7 days for post extraction infection.     Esaw Dace., D.D.S.     WEM/MEDQ  D:  03/02/2013  T:  03/03/2013  Job:  [id number]

## 2013-03-05 ENCOUNTER — Encounter (HOSPITAL_COMMUNITY): Payer: Self-pay | Admitting: Dentistry

## 2017-08-31 ENCOUNTER — Encounter (HOSPITAL_COMMUNITY): Payer: Self-pay

## 2017-08-31 ENCOUNTER — Emergency Department (HOSPITAL_COMMUNITY)
Admission: EM | Admit: 2017-08-31 | Discharge: 2017-09-01 | Disposition: A | Discharge status: Home / Self Care | Payer: Medicare Other | Attending: Emergency Medicine | Admitting: Emergency Medicine

## 2017-08-31 ENCOUNTER — Other Ambulatory Visit: Payer: Self-pay

## 2017-08-31 DIAGNOSIS — S0990XA Unspecified injury of head, initial encounter: Secondary | ICD-10-CM | POA: Diagnosis present

## 2017-08-31 DIAGNOSIS — X58XXXA Exposure to other specified factors, initial encounter: Secondary | ICD-10-CM | POA: Insufficient documentation

## 2017-08-31 DIAGNOSIS — Y929 Unspecified place or not applicable: Secondary | ICD-10-CM | POA: Diagnosis not present

## 2017-08-31 DIAGNOSIS — Y939 Activity, unspecified: Secondary | ICD-10-CM | POA: Insufficient documentation

## 2017-08-31 DIAGNOSIS — Y999 Unspecified external cause status: Secondary | ICD-10-CM | POA: Insufficient documentation

## 2017-08-31 DIAGNOSIS — Z79899 Other long term (current) drug therapy: Secondary | ICD-10-CM | POA: Insufficient documentation

## 2017-08-31 DIAGNOSIS — W19XXXA Unspecified fall, initial encounter: Secondary | ICD-10-CM

## 2017-08-31 NOTE — ED Triage Notes (Signed)
Pt had a fall this afternoon, hit his head on the tub, not on blood thinners, no c/o of pain, pt has MR

## 2017-09-01 ENCOUNTER — Emergency Department (HOSPITAL_COMMUNITY): Payer: Medicare Other

## 2017-09-01 DIAGNOSIS — S0990XA Unspecified injury of head, initial encounter: Secondary | ICD-10-CM | POA: Diagnosis not present

## 2017-09-01 MED ORDER — LORAZEPAM 2 MG/ML IJ SOLN
2.0000 mg | Freq: Once | INTRAMUSCULAR | Status: AC
  Administered 2017-09-01: 2 mg via INTRAMUSCULAR
  Filled 2017-09-01: qty 1

## 2017-09-01 MED ORDER — LORAZEPAM 2 MG/ML IJ SOLN
1.0000 mg | Freq: Once | INTRAMUSCULAR | Status: AC
  Administered 2017-09-01: 1 mg via INTRAMUSCULAR
  Filled 2017-09-01: qty 1

## 2017-09-01 NOTE — ED Notes (Signed)
Pt. Called for vitals no answer  

## 2017-09-01 NOTE — ED Notes (Signed)
Pt. Is waiting in Pod A

## 2017-09-01 NOTE — ED Notes (Signed)
Pt is from a group home and is here for a fall that occurred at 1700 on 08/31/17. Pt has a facility representative with him who states the pt did not have any LOC or vomiting. Staff reports the pt is acting his normal.

## 2017-09-01 NOTE — ED Provider Notes (Signed)
[facility] EMERGENCY DEPARTMENT Provider Note   CSN: [id number] Arrival date & time: 08/31/17  2124     History   Chief Complaint Chief Complaint  Patient presents with  . Fall    HPI Philip Vazquez is a 47 y.o. male.  The history is provided by the EMS personnel. The history is limited by the absence of a caregiver (MR).  Fall  This is a new problem. The current episode started 6 to 12 hours ago. The problem occurs constantly. The problem has not changed since onset.Pertinent negatives include no chest pain, no abdominal pain, no headaches and no shortness of breath. Nothing aggravates the symptoms. Nothing relieves the symptoms. He has tried nothing for the symptoms. The treatment provided no relief.    Past Medical History:  Diagnosis Date  . Autism   . Eczema   . Mental retardation   . Myopia   . Seizure (HCC)   . Seizure disorder (HCC)   . Shortness of breath     Patient Active Problem List   Diagnosis Date Noted  . Discolored nails 11/28/2012    Past Surgical History:  Procedure Laterality Date  . DENTAL RESTORATION/EXTRACTION WITH X-RAY N/A 03/02/2013   Procedure: RECALL DENTAL EXAMINATION AND CLEANING; FULL MOUTH X-RAYS' FULL MOUTH DEBRIDEMENT; EXTRACTION OF TEETH #1, 16, 30; COMPOSITE FILLINGS OF TEETH #7, 8, 9, 10;  Surgeon: Esaw Dace., DDS;  Location: [facility] OR;  Service: Oral Surgery;  Laterality: N/A;        Home Medications    Prior to Admission medications   Medication Sig Start Date End Date Taking? Authorizing Provider  ARIPiprazole (ABILIFY) 5 MG tablet Take 5 mg by mouth daily.    [provider]  carbamazepine (TEGRETOL) 100 MG chewable tablet Chew 300 mg by mouth 2 (two) times daily.    [provider]  carbamazepine (TEGRETOL) 200 MG tablet Take 400 mg by mouth at bedtime.    [provider]  clonazePAM (KLONOPIN) 1 MG tablet Take 1 mg by mouth 4 (four) times daily.     [provider]  gabapentin (NEURONTIN) 800 MG tablet Take 1 tablet (800 mg total) by mouth 3 (three) times daily. 02/03/13   [name], MD  hydrOXYzine (ATARAX/VISTARIL) 50 MG tablet Take 50 mg by mouth 3 (three) times daily as needed for anxiety.    [provider]  lactulose (CHRONULAC) 10 GM/15ML solution Take 45 mLs by mouth 2 (two) times daily.      [provider]  LORazepam (ATIVAN) 1 MG tablet Take 3 mg by mouth daily as needed for anxiety.    [provider]  oral electrolytes (THERMOTABS) TABS tablet Take 2 tablets by mouth daily.    [provider]  polyethylene glycol (MIRALAX / GLYCOLAX) packet Take 17 g by mouth daily.    [provider]  simvastatin (ZOCOR) 20 MG tablet Take 30 mg by mouth daily.    [provider]    Family History No family history on file.  Social History Social History   Tobacco Use  . Smoking status: Never Smoker  . Smokeless tobacco: Never Used  Substance Use Topics  . Alcohol use: No  . Drug use: No     Allergies   Bee venom   Review of Systems Review of Systems  Unable to perform ROS: Other  Respiratory: Negative for shortness of breath.   Cardiovascular: Negative for chest pain.  Gastrointestinal: Negative for  abdominal pain.  Neurological: Negative for headaches.     Physical Exam Updated Vital Signs BP 105/80   Pulse 76   Temp (!) 97 F (36.1 C)   Resp 20   SpO2 96%   Physical Exam  Constitutional: He appears well-developed and well-nourished. No distress.  HENT:  Head: Normocephalic and atraumatic.  Mouth/Throat: Oropharynx is clear and moist.  Eyes: Pupils are equal, round, and reactive to light. Conjunctivae are normal.  Neck: Normal range of motion. Neck supple.  Cardiovascular: Normal rate, regular rhythm, normal heart sounds and intact distal pulses.  Pulmonary/Chest: Effort normal and breath sounds normal. No stridor. He has no wheezes.  Abdominal: Soft.  Bowel sounds are normal. There is no tenderness. There is no guarding.  Musculoskeletal: Normal range of motion.       Cervical back: Normal.       Thoracic back: Normal.       Lumbar back: Normal.  Neurological: He is alert. He displays normal reflexes.  Skin: Skin is warm and dry. Capillary refill takes less than 2 seconds.     ED Treatments / Results  Labs (all labs ordered are listed, but only abnormal results are displayed) Labs Reviewed - No data to display  EKG None  Radiology No results found.  Procedures Procedures (including critical care time)  Medications Ordered in ED Medications  LORazepam (ATIVAN) injection 2 mg (2 mg Intramuscular Given 09/01/17 0329)  LORazepam (ATIVAN) injection 1 mg (1 mg Intramuscular Given 09/01/17 0439)       Final Clinical Impressions(s) / ED Diagnoses   Return for weakness, numbness, changes in vision or speech, fevers >100.4 unrelieved by medication, shortness of breath, intractable vomiting, or diarrhea, abdominal pain, Inability to tolerate liquids or food, cough, altered mental status or any concerns. No signs of systemic illness or infection. The patient is nontoxic-appearing on exam and vital signs are within normal limits.   I have reviewed the triage vital signs and the nursing notes. Pertinent labs &imaging results that were available during my care of the patient were reviewed by me and considered in my medical decision making (see chart for details).  After history, exam, and medical workup I feel the patient has been appropriately medically screened and is safe for discharge home. Pertinent diagnoses were discussed with the patient. Patient was given return precautions.    Cashmere Dingley, MD 09/01/17 1610

## 2017-09-07 NOTE — ED Notes (Signed)
Philip Vazquez from the [facility] called, requested copies of pt.s CT scan results.  They were faxed to the [facility] , [phone]

## 2018-07-11 IMAGING — CT CT CERVICAL SPINE W/O CM
2 of 16 series · 4 of 33 positions shown, 5 images · non-contrast
Comparison: 02/03/2013 CT of the head.

CLINICAL DATA: 47 y/o  M; status post fall.

EXAM:
CT HEAD WITHOUT CONTRAST
CT CERVICAL SPINE WITHOUT CONTRAST
TECHNIQUE: Multidetector CT imaging of the head and cervical spine was
performed following the standard protocol without intravenous
contrast. Multiplanar CT image reconstructions of the cervical spine
were also generated.

[Series 17: c_spine 2.0 st · axial · 0.31mm/px · z∈[+1148,+1330]mm · 3 of 92 slices shown, 4 images]
[im 1/92  soft-tissue]
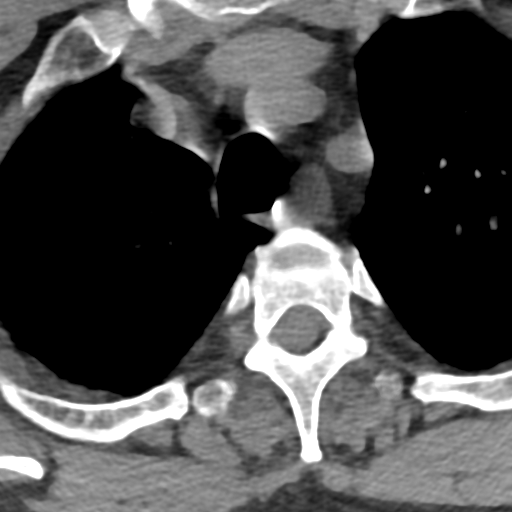
[im 1/92  bone]
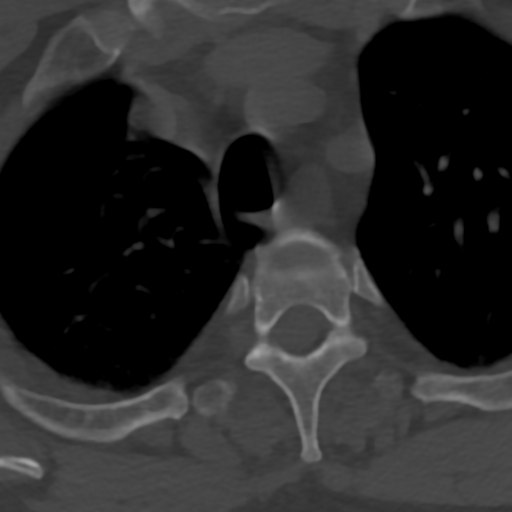
[im 46/92  bone]
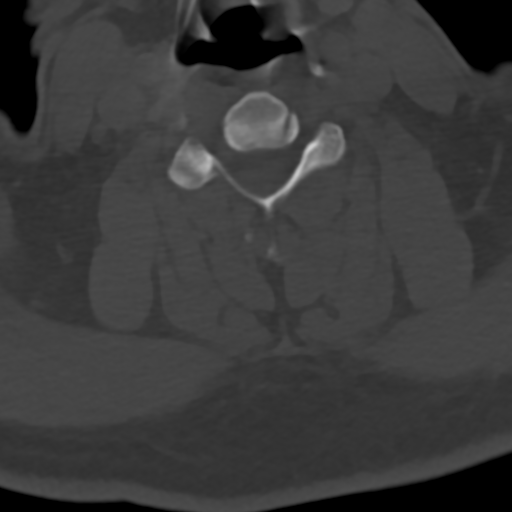
[im 92/92  bone]
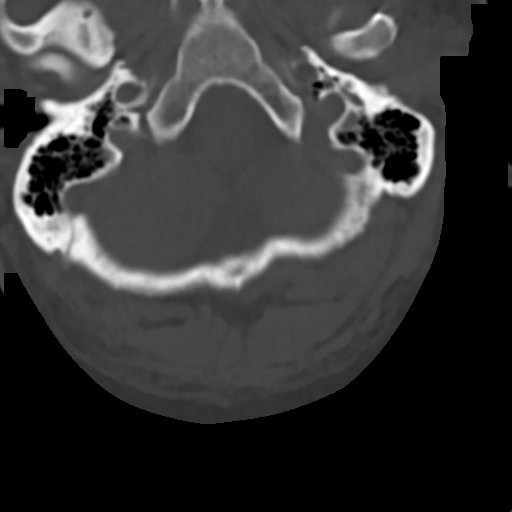

[Series 19: c_spine 2.0 sag bone · sagittal · 0.33mm/px · 1 of 61 slices shown]
[im 31/61  bone]
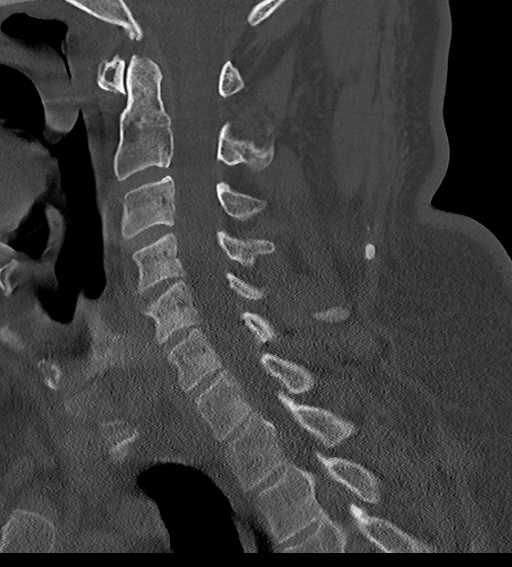

[4 of 33 positions shown; findings below may reference images not displayed]

FINDINGS: CT HEAD FINDINGS

Brain: No evidence of acute infarction, hemorrhage, hydrocephalus,
extra-axial collection or mass lesion/mass effect.

Vascular: No hyperdense vessel or unexpected calcification.

Skull: Normal. Negative for fracture or focal lesion.

Sinuses/Orbits: No acute finding.

Other: None.

CT CERVICAL SPINE FINDINGS

Alignment: Normal.

Skull base and vertebrae: No acute fracture. No primary bone lesion
or focal pathologic process.

Soft tissues and spinal canal: No prevertebral fluid or swelling. No
visible canal hematoma.

Disc levels:  Negative.

Upper chest: Negative.

Other: Negative.
IMPRESSION: 1. Negative stable CT of the head.
2. Negative CT of the cervical spine.

By: Adianti Marfiah M.D.

## 2021-04-27 ENCOUNTER — Ambulatory Visit: Payer: Self-pay | Admitting: Dentistry

## 2021-04-28 ENCOUNTER — Encounter (HOSPITAL_COMMUNITY): Payer: Self-pay | Admitting: *Deleted

## 2021-04-28 NOTE — Progress Notes (Signed)
Patient is in a group home.  Spoke with Victorino Dike at [facility] [phone] for PAT information and instructions for DOS.  Instructions were faxed to   DUE TO COVID-19 ONLY ONE VISITOR IS ALLOWED TO COME WITH YOU AND STAY IN THE WAITING ROOM ONLY DURING PRE OP AND PROCEDURE DAY OF SURGERY.   PCP - Dr Nicolasa Ducking Cardiologist - n/a  Chest x-ray - n/a EKG - 07/23/20 CE (Requested) Stress Test - n/a ECHO - n/a Cardiac Cath - n/a  ICD Pacemaker/Loop - n/a  Sleep Study -  n/a CPAP - none  Anesthesia review: Yes  STOP now taking any Aspirin (unless otherwise instructed by your surgeon), Aleve, Naproxen, Ibuprofen, Motrin, Advil, Goody's, BC's, all herbal medications, fish oil, and all vitamins.   Coronavirus Screening Covid test n/a Ambulatory Surgery  Does the patient have any of the following symptoms:  Cough yes/no: No Fever (>100.47F)  yes/no: No Runny nose yes/no: No Sore throat yes/no: No Difficulty breathing/shortness of breath  yes/no: No  Have you traveled in the last 14 days and where? yes/no: No  Victorino Dike from [facility] verbalized understanding of instructions that were given via phone.  Instructions were faxed to [phone], Beckey Rutter @ [facility].  Verified that she received fax that was sent 04/28/21.  No questions of concerns about instructions.Marland Kitchen

## 2021-04-28 NOTE — Progress Notes (Signed)
Surgical Instructions for Codie Krogh    Your procedure is scheduled on Thursday, 04/30/21.  Report to [facility] Main Entrance "A" at 5:30 A.M., then check in with the Admitting office.  Call this number if you have problems the morning of surgery:  [phone]   If you have any questions prior to your surgery date call [phone]: Open Monday-Friday 8am-4pm    Remember:  Do not eat or drink after midnight Wednesday (04/29/21).   Take these medicines the morning of surgery with A SIP OF WATER  carbamazepine (TEGRETOL) clonazePAM (KLONOPIN) gabapentin (NEURONTIN) haloperidol (HALDOL) hydrOXYzine (VISTARIL)  levothyroxine (SYNTHROID OLANZapine (ZYPREXA)  triazolam (HALCION)    As of today, STOP taking any Aspirin (unless otherwise instructed by your surgeon) Aleve, Naproxen, Ibuprofen, Motrin, Advil, Goody's, BC's, all herbal medications, fish oil, and all vitamins.           Do not wear jewelry Do not wear lotions, powders, colognes, or deodorant. Men may shave face and neck. Do not bring valuables to the hospital.              [facility] is not responsible for any belongings or valuables.  Do NOT Smoke (Tobacco/Vaping)  24 hours prior to your procedure  Contacts, glasses, hearing aids, dentures or partials may not be worn into surgery, please bring cases for these belongings   For patients admitted to the hospital, discharge time will be determined by your treatment team.   Patients discharged the day of surgery will not be allowed to drive home, and someone needs to stay with them for 24 hours.  NO VISITORS WILL BE ALLOWED IN PRE-OP WHERE PATIENTS ARE PREPPED FOR SURGERY.  ONLY 1 SUPPORT PERSON MAY BE PRESENT IN THE WAITING ROOM WHILE YOU ARE IN SURGERY.  IF YOU ARE TO BE ADMITTED, ONCE YOU ARE IN YOUR ROOM YOU WILL BE ALLOWED TWO (2) VISITORS. 1 (ONE) VISITOR MAY STAY OVERNIGHT BUT MUST ARRIVE TO THE ROOM BY 8pm.     Oral Hygiene is also important to reduce your  risk of infection.  Remember - BRUSH YOUR TEETH THE MORNING OF SURGERY WITH YOUR REGULAR TOOTHPASTE

## 2021-04-29 ENCOUNTER — Encounter (HOSPITAL_COMMUNITY): Payer: Self-pay | Admitting: *Deleted

## 2021-04-29 NOTE — Anesthesia Preprocedure Evaluation (Addendum)
Anesthesia Evaluation  Patient identified by MRN, date of birth, ID band Patient awake    Reviewed: Allergy & Precautions, NPO status , Patient's Chart, lab work & pertinent test results  Airway Mallampati: I  TM Distance: >3 FB Neck ROM: Full    Dental no notable dental hx.    Pulmonary    Pulmonary exam normal breath sounds clear to auscultation       Cardiovascular Exercise Tolerance: Good negative cardio ROS Normal cardiovascular exam     Neuro/Psych Seizures -,  PSYCHIATRIC DISORDERS Anxiety Mental retardation autism   GI/Hepatic negative GI ROS, Neg liver ROS,   Endo/Other  Hypothyroidism   Renal/GU negative Renal ROS  negative genitourinary   Musculoskeletal negative musculoskeletal ROS (+)   Abdominal   Peds negative pediatric ROS (+)  Hematology negative hematology ROS (+)   Anesthesia Other Findings   Reproductive/Obstetrics                            Anesthesia Physical Anesthesia Plan  ASA: 3  Anesthesia Plan: General   Post-op Pain Management:    Induction: Intravenous  PONV Risk Score and Plan: 2 and Treatment may vary due to age or medical condition  Airway Management Planned: Nasal ETT  Additional Equipment: None  Intra-op Plan:   Post-operative Plan: Extubation in OR  Informed Consent:   Plan Discussed with:   Anesthesia Plan Comments: (Patient required 400mg  IM ketamine in 2014)        Anesthesia Quick Evaluation

## 2021-04-30 ENCOUNTER — Ambulatory Visit (HOSPITAL_COMMUNITY): Payer: Medicare Other | Admitting: Anesthesiology

## 2021-04-30 ENCOUNTER — Encounter (HOSPITAL_COMMUNITY)
Admission: RE | Disposition: A | Discharge status: Home / Self Care | Payer: Self-pay | Source: Home / Self Care | Attending: Dentistry

## 2021-04-30 ENCOUNTER — Other Ambulatory Visit: Payer: Self-pay

## 2021-04-30 ENCOUNTER — Ambulatory Visit (HOSPITAL_COMMUNITY)
Admission: RE | Admit: 2021-04-30 | Discharge: 2021-04-30 | Disposition: A | Discharge status: Home / Self Care | Payer: Medicare Other | Attending: Dentistry | Admitting: Dentistry

## 2021-04-30 ENCOUNTER — Encounter (HOSPITAL_COMMUNITY): Payer: Self-pay

## 2021-04-30 DIAGNOSIS — F840 Autistic disorder: Secondary | ICD-10-CM | POA: Insufficient documentation

## 2021-04-30 DIAGNOSIS — F79 Unspecified intellectual disabilities: Secondary | ICD-10-CM | POA: Diagnosis not present

## 2021-04-30 DIAGNOSIS — K029 Dental caries, unspecified: Secondary | ICD-10-CM | POA: Diagnosis present

## 2021-04-30 HISTORY — DX: Hypothyroidism, unspecified: E03.9

## 2021-04-30 HISTORY — DX: Constipation, unspecified: K59.00

## 2021-04-30 HISTORY — DX: Anxiety disorder, unspecified: F41.9

## 2021-04-30 HISTORY — PX: DENTAL RESTORATION/EXTRACTION WITH X-RAY: SHX5796

## 2021-04-30 SURGERY — DENTAL RESTORATION/EXTRACTION WITH X-RAY
Anesthesia: General

## 2021-04-30 MED ORDER — ONDANSETRON HCL 4 MG/2ML IJ SOLN: 4.0000 mg | Freq: Once | INTRAMUSCULAR | Status: DC | PRN

## 2021-04-30 MED ORDER — AMISULPRIDE (ANTIEMETIC) 5 MG/2ML IV SOLN: 10.0000 mg | Freq: Once | INTRAVENOUS | Status: DC | PRN

## 2021-04-30 MED ORDER — GLYCOPYRROLATE PF 0.2 MG/ML IJ SOSY
PREFILLED_SYRINGE | INTRAMUSCULAR | Status: DC | PRN
  Administered 2021-04-30: .2 mg via INTRAVENOUS

## 2021-04-30 MED ORDER — KETOROLAC TROMETHAMINE 30 MG/ML IJ SOLN
INTRAMUSCULAR | Status: DC | PRN
  Administered 2021-04-30: 30 mg via INTRAVENOUS

## 2021-04-30 MED ORDER — CEFAZOLIN SODIUM-DEXTROSE 2-3 GM-%(50ML) IV SOLR
INTRAVENOUS | Status: DC | PRN
  Administered 2021-04-30: 2 g via INTRAVENOUS

## 2021-04-30 MED ORDER — ROCURONIUM BROMIDE 10 MG/ML (PF) SYRINGE
PREFILLED_SYRINGE | INTRAVENOUS | Status: DC | PRN
  Administered 2021-04-30: 30 mg via INTRAVENOUS
  Administered 2021-04-30: 70 mg via INTRAVENOUS
  Administered 2021-04-30: 20 mg via INTRAVENOUS

## 2021-04-30 MED ORDER — ONDANSETRON HCL 4 MG/2ML IJ SOLN
INTRAMUSCULAR | Status: DC | PRN
  Administered 2021-04-30: 4 mg via INTRAVENOUS

## 2021-04-30 MED ORDER — CHLORHEXIDINE GLUCONATE 0.12 % MT SOLN
OROMUCOSAL | Status: AC
  Filled 2021-04-30: qty 15

## 2021-04-30 MED ORDER — OXYCODONE HCL 5 MG/5ML PO SOLN: 5.0000 mg | Freq: Once | ORAL | Status: DC | PRN

## 2021-04-30 MED ORDER — OXYMETAZOLINE HCL 0.05 % NA SOLN
NASAL | Status: DC | PRN
  Administered 2021-04-30: 2 via NASAL

## 2021-04-30 MED ORDER — KETAMINE HCL 100 MG/ML IJ SOLN
INTRAMUSCULAR | Status: DC | PRN
  Administered 2021-04-30: 350 mg via ORAL

## 2021-04-30 MED ORDER — OXYMETAZOLINE HCL 0.05 % NA SOLN
NASAL | Status: AC
  Filled 2021-04-30: qty 30

## 2021-04-30 MED ORDER — DEXAMETHASONE SODIUM PHOSPHATE 10 MG/ML IJ SOLN
INTRAMUSCULAR | Status: AC
  Filled 2021-04-30: qty 1

## 2021-04-30 MED ORDER — SODIUM CHLORIDE (PF) 0.9 % IJ SOLN
INTRAMUSCULAR | Status: AC
  Filled 2021-04-30: qty 10

## 2021-04-30 MED ORDER — OXYMETAZOLINE HCL 0.05 % NA SOLN
NASAL | Status: DC | PRN
  Administered 2021-04-30: 1

## 2021-04-30 MED ORDER — MIDAZOLAM HCL 2 MG/2ML IJ SOLN
INTRAMUSCULAR | Status: DC | PRN
  Administered 2021-04-30: 2 mg via INTRAVENOUS

## 2021-04-30 MED ORDER — PROPOFOL 10 MG/ML IV BOLUS
INTRAVENOUS | Status: AC
  Filled 2021-04-30: qty 20

## 2021-04-30 MED ORDER — GLYCOPYRROLATE PF 0.2 MG/ML IJ SOSY
PREFILLED_SYRINGE | INTRAMUSCULAR | Status: AC
  Filled 2021-04-30: qty 1

## 2021-04-30 MED ORDER — PHENYLEPHRINE 40 MCG/ML (10ML) SYRINGE FOR IV PUSH (FOR BLOOD PRESSURE SUPPORT)
PREFILLED_SYRINGE | INTRAVENOUS | Status: DC | PRN
  Administered 2021-04-30: 80 ug via INTRAVENOUS
  Administered 2021-04-30 (×2): 120 ug via INTRAVENOUS

## 2021-04-30 MED ORDER — ORAL CARE MOUTH RINSE: 15.0000 mL | Freq: Once | OROMUCOSAL | Status: DC

## 2021-04-30 MED ORDER — LIDOCAINE-EPINEPHRINE 2 %-1:100000 IJ SOLN
INTRAMUSCULAR | Status: AC
  Filled 2021-04-30: qty 1.7

## 2021-04-30 MED ORDER — ROCURONIUM BROMIDE 10 MG/ML (PF) SYRINGE
PREFILLED_SYRINGE | INTRAVENOUS | Status: AC
  Filled 2021-04-30: qty 10

## 2021-04-30 MED ORDER — EPHEDRINE SULFATE-NACL 50-0.9 MG/10ML-% IV SOSY
PREFILLED_SYRINGE | INTRAVENOUS | Status: DC | PRN
Start: 2021-04-30 — End: 2021-04-30
  Administered 2021-04-30: 5 mg via INTRAVENOUS

## 2021-04-30 MED ORDER — LIDOCAINE-EPINEPHRINE 2 %-1:100000 IJ SOLN
INTRAMUSCULAR | Status: DC | PRN
  Administered 2021-04-30: 3.4 mL via INTRADERMAL

## 2021-04-30 MED ORDER — PHENYLEPHRINE HCL-NACL 20-0.9 MG/250ML-% IV SOLN
INTRAVENOUS | Status: DC | PRN
  Administered 2021-04-30: 30 ug/min via INTRAVENOUS

## 2021-04-30 MED ORDER — MIDAZOLAM HCL 2 MG/2ML IJ SOLN
INTRAMUSCULAR | Status: AC
  Filled 2021-04-30: qty 2

## 2021-04-30 MED ORDER — CEFAZOLIN SODIUM 1 G IJ SOLR
INTRAMUSCULAR | Status: AC
  Filled 2021-04-30: qty 20

## 2021-04-30 MED ORDER — EPHEDRINE 5 MG/ML INJ
INTRAVENOUS | Status: AC
  Filled 2021-04-30: qty 5

## 2021-04-30 MED ORDER — LIDOCAINE 2% (20 MG/ML) 5 ML SYRINGE
INTRAMUSCULAR | Status: AC
  Filled 2021-04-30: qty 5

## 2021-04-30 MED ORDER — PROPOFOL 10 MG/ML IV BOLUS
INTRAVENOUS | Status: DC | PRN
Start: 2021-04-30 — End: 2021-04-30
  Administered 2021-04-30: 150 mg via INTRAVENOUS

## 2021-04-30 MED ORDER — LACTATED RINGERS IV SOLN: INTRAVENOUS | Status: DC

## 2021-04-30 MED ORDER — ONDANSETRON HCL 4 MG/2ML IJ SOLN
INTRAMUSCULAR | Status: AC
  Filled 2021-04-30: qty 2

## 2021-04-30 MED ORDER — LIDOCAINE 2% (20 MG/ML) 5 ML SYRINGE
INTRAMUSCULAR | Status: DC | PRN
  Administered 2021-04-30: 50 mg via INTRAVENOUS

## 2021-04-30 MED ORDER — FENTANYL CITRATE (PF) 100 MCG/2ML IJ SOLN: 25.0000 ug | INTRAMUSCULAR | Status: DC | PRN

## 2021-04-30 MED ORDER — CHLORHEXIDINE GLUCONATE 0.12 % MT SOLN: 15.0000 mL | Freq: Once | OROMUCOSAL | Status: DC

## 2021-04-30 MED ORDER — OXYCODONE HCL 5 MG PO TABS: 5.0000 mg | ORAL_TABLET | Freq: Once | ORAL | Status: DC | PRN

## 2021-04-30 MED ORDER — SUGAMMADEX SODIUM 200 MG/2ML IV SOLN
INTRAVENOUS | Status: DC | PRN
  Administered 2021-04-30: 400 mg via INTRAVENOUS

## 2021-04-30 MED ORDER — FENTANYL CITRATE (PF) 250 MCG/5ML IJ SOLN
INTRAMUSCULAR | Status: DC | PRN
  Administered 2021-04-30: 50 ug via INTRAVENOUS
  Administered 2021-04-30: 25 ug via INTRAVENOUS

## 2021-04-30 MED ORDER — KETAMINE HCL 100 MG/ML IJ SOLN
INTRAMUSCULAR | Status: AC
  Filled 2021-04-30: qty 1

## 2021-04-30 MED ORDER — FENTANYL CITRATE (PF) 250 MCG/5ML IJ SOLN
INTRAMUSCULAR | Status: AC
  Filled 2021-04-30: qty 5

## 2021-04-30 MED ORDER — PHENYLEPHRINE 40 MCG/ML (10ML) SYRINGE FOR IV PUSH (FOR BLOOD PRESSURE SUPPORT)
PREFILLED_SYRINGE | INTRAVENOUS | Status: AC
  Filled 2021-04-30: qty 10

## 2021-04-30 MED ORDER — DEXAMETHASONE SODIUM PHOSPHATE 10 MG/ML IJ SOLN
INTRAMUSCULAR | Status: DC | PRN
  Administered 2021-04-30: 10 mg via INTRAVENOUS

## 2021-04-30 SURGICAL SUPPLY — 22 items
BLADE SURG 15 STRL LF DISP TIS (BLADE) ×1 IMPLANT
BLADE SURG 15 STRL SS (BLADE) ×2
CANISTER SUCT 3000ML PPV (MISCELLANEOUS) ×2 IMPLANT
COVER BACK TABLE 60X90IN (DRAPES) ×2 IMPLANT
COVER MAYO STAND STRL (DRAPES) ×2 IMPLANT
COVER SURGICAL LIGHT HANDLE (MISCELLANEOUS) ×2 IMPLANT
DRAPE HALF SHEET 40X57 (DRAPES) ×2 IMPLANT
GAUZE SPONGE 4X4 16PLY XRAY LF (GAUZE/BANDAGES/DRESSINGS) ×2 IMPLANT
GLOVE SURG ENC MOIS LTX SZ6.5 (GLOVE) ×2 IMPLANT
GOWN STRL REUS W/ TWL LRG LVL3 (GOWN DISPOSABLE) ×2 IMPLANT
GOWN STRL REUS W/TWL LRG LVL3 (GOWN DISPOSABLE) ×4
KIT BASIN OR (CUSTOM PROCEDURE TRAY) ×2 IMPLANT
KIT TURNOVER KIT B (KITS) ×2 IMPLANT
NDL DENTAL 27 LONG (NEEDLE) ×1 IMPLANT
NEEDLE DENTAL 27 LONG (NEEDLE) ×2 IMPLANT
PAD ARMBOARD 7.5X6 YLW CONV (MISCELLANEOUS) ×4 IMPLANT
SUT CHROMIC 3 0 PS 2 (SUTURE) ×2 IMPLANT
TOOTHBRUSH ADULT (PERSONAL CARE ITEMS) ×2 IMPLANT
TOWEL GREEN STERILE (TOWEL DISPOSABLE) ×2 IMPLANT
TUBE CONNECTING 12X1/4 (SUCTIONS) ×2 IMPLANT
WATER TABLETS ICX (MISCELLANEOUS) ×2 IMPLANT
YANKAUER SUCT BULB TIP NO VENT (SUCTIONS) ×2 IMPLANT

## 2021-04-30 NOTE — Addendum Note (Signed)
Addendum  created 04/30/21 1350 by Lorie Phenix, CRNA   Intraprocedure Meds edited

## 2021-04-30 NOTE — Transfer of Care (Signed)
Immediate Anesthesia Transfer of Care Note  Patient: Philip Vazquez  Procedure(s) Performed: DENTAL RESTORATION/EXTRACTION WITH X-RAY. #5, #17 surgical extractions. Fillings on 66F, 36F, 31F, 9DLF, 10DF, 11DF, 75F, 28DOB, 32 B  Patient Location: PACU  Anesthesia Type:General  Level of Consciousness: drowsy  Airway & Oxygen Therapy: Patient Spontanous Breathing and Patient connected to face mask oxygen  Post-op Assessment: Report given to RN and Post -op Vital signs reviewed and stable  Post vital signs: Reviewed and stable  Last Vitals:  Vitals Value Taken Time  BP 139/66 04/30/21 1017  Temp    Pulse 79 04/30/21 1018  Resp 14 04/30/21 1018  SpO2 100 % 04/30/21 1018  Vitals shown include unvalidated device data.  Last Pain:  Vitals:   04/30/21 0601  TempSrc: Axillary         Complications: No notable events documented.

## 2021-04-30 NOTE — H&P (Signed)
H&P reviewed. Stable for surgery Gaven Eugene H Nikolette Reindl, DMD  

## 2021-04-30 NOTE — Anesthesia Procedure Notes (Signed)
Procedure Name: Intubation Date/Time: 04/30/2021 8:06 AM Performed by: Lorie Phenix, CRNA Pre-anesthesia Checklist: Patient identified, Emergency Drugs available, Suction available and Patient being monitored Patient Re-evaluated:Patient Re-evaluated prior to induction Oxygen Delivery Method: Circle system utilized Preoxygenation: Pre-oxygenation with 100% oxygen Induction Type: IV induction and Inhalational induction Ventilation: Mask ventilation without difficulty Laryngoscope Size: Mac and 4 Grade View: Grade I Nasal Tubes: Nasal Rae, Magill forceps- large, utilized, Right and Nasal prep performed Tube size: 7.0 mm Number of attempts: 2 Placement Confirmation: ETT inserted through vocal cords under direct vision, positive ETCO2 and breath sounds checked- equal and bilateral Tube secured with: Tape Dental Injury: Teeth and Oropharynx as per pre-operative assessment  Comments: First attempt with 7.5 ETT, unable to pass turbinates. Second attempt with 7.0 ETT successful.

## 2021-04-30 NOTE — Op Note (Signed)
[facility]  04/30/2021 Philip Vazquez [id number]  Preop DX: Dental caries/behavior management issues due to intellectual disabilities/developmental disabilities. Dental Care provided in OR for medically necessary treatment.  Surgeon: Joanna Hews, DMD  Assistant: April Riggsbee and hospital staff.  Anesthesia: General  Procedure: The patient was brought into the operating room and placed on the table in a supine position.  General anesthesia was administered via nasal intubation.  The patient was prepped and draped in the usual manner for an intra-oral general dentistry procedure. The oropharynx was suctioned and a moistened oropharyngeal throat pack was placed.    A full intra-oral exam including all hard and soft tissues was performed.  Type of Exam: Recall   Soft Tissue Exam: Floor of the mouth: Normal Buccal mucosa: Normal Soft palate: Normal Hard palate: Normal Tongue: Normal Gingival:  Inflammed Frenum: Normal  Hard tissue exam:  Present: # 2-15, 17-29, 31-32 Missing: # 1, 16, 30 Radiographic findings decay: # 5, 9D, 17B, 28D,  Full mouth series of digital radiographs taken and reviewed. A comprehensive treatment plan was developed.  Operative care was accomplished in a standard fashion using high/low speed drills with copious irrigation.  Routine extractions were accomplished with simple elevation and use of forceps. Surgical Extractions were done in a standard fashion with full facial thickness flaps to gain access, otectomy / osteoplasty with copious irrigation to expose the teeth. Teeth with multi-roots were sectioned as needed to minimize surgical trauma.   All surgical sites were irrigated with copious amounts of saline. Gel foam was placed in the sockets and hemostasis established with firm pressure. Surgical sites were closed with 3-0 Chromic sutures.  Local Anes:Lidocaine 2% with 1:100,000 epinephrine 3.4 mls The estimated blood loss was 30 mls.    Upon completion of all procedures the oropharynx was irrigated of all debris. Mouth was suctioned dry and a posterior throat pack was carefully removed with constant suction. Hemostasis was established and a gauze pack was placed as an intraoral pressure dressing. After spontaneous respirations the patient was extubated and transported to the Post-Anesthesia care unit in awake but in a sedated condition. The patient tolerated the procedure well and without complications.  An explanation of procedures and extractions were given to caregiver and nurse (phone).  Operative Procedures:  Full mouth debridement: Yes Extractions completed: # 5, 17 Glass Ionomers: # 20F(coronal/smooth/dentin), 22F(coronal/smooth/dentin), 33F(coronal/smooth/dentin), 9(coronal/smooth/dentin), 10DF(coronal/smooth/dentin), 11DF(coronal/smooth/dentin), 22F(coronal/smooth/dentin), 28DOB(coronal/chewing/dentin),32B(coronal/smooth/dentin) Fluoride varnish: Yes   Postoperative Meds:  OTC ibuprofen 600mg  and tylenol 500mg  every 6 hours prn pain  Postoperative Instructions: Extraction sheet signed and given to patient representative.    , DMD

## 2021-04-30 NOTE — Brief Op Note (Signed)
04/30/2021  10:01 AM  PATIENT:  Gaspar Skeeters  51 y.o. male  PRE-OPERATIVE DIAGNOSIS:  DENTAL CARIES  POST-OPERATIVE DIAGNOSIS:  DENTAL CARIES  PROCEDURE:  Procedure(s): DENTAL RESTORATION/EXTRACTION WITH X-RAY. #5, #17 surgical extractions. Fillings on 73F, 74F, 44F, 9DLF, 10DF, 11DF, 27F, 28DOB, 32 B (N/A)  SURGEON:  Surgeon(s) and Role:    * Reem Fleury, Arley Phenix, DMD - Primary    ASSISTANTS: April Riggsbee  and hospital staff  ANESTHESIA:   general  EBL:  30 mL   BLOOD ADMINISTERED:none  DRAINS: none   LOCAL MEDICATIONS USED:  LIDOCAINE  and Amount: 3.4 ml  SPECIMEN:  No Specimen  DISPOSITION OF SPECIMEN:  N/A  COUNTS:  YES  TOURNIQUET:  * No tourniquets in log *  DICTATION: .Dragon Dictation  PLAN OF CARE: Discharge to home after PACU  PATIENT DISPOSITION:  PACU - hemodynamically stable.   Delay start of Pharmacological VTE agent (>24hrs) due to surgical blood loss or risk of bleeding: not applicable

## 2021-04-30 NOTE — Anesthesia Postprocedure Evaluation (Signed)
Anesthesia Post Note  Patient: [name]  Procedure(s) Performed: DENTAL RESTORATION/EXTRACTION WITH X-RAY. #5, #17 surgical extractions. Fillings on 61F, 23F, 57F, 9DLF, 10DF, 11DF, 74F, 28DOB, 32 B     Patient location during evaluation: PACU Anesthesia Type: General Level of consciousness: awake Pain management: pain level controlled Vital Signs Assessment: post-procedure vital signs reviewed and stable Respiratory status: spontaneous breathing and respiratory function stable Cardiovascular status: stable Postop Assessment: no apparent nausea or vomiting Anesthetic complications: no   No notable events documented.  Last Vitals:  Vitals:   04/30/21 1032 04/30/21 1045  BP: (!) 144/82 139/82  Pulse: 84 79  Resp: 20 14  Temp:  (!) 36.1 C  SpO2: 97% 98%    Last Pain:  Vitals:   04/30/21 0601  TempSrc: Axillary                 Candra Royston Cowper

## 2021-05-01 ENCOUNTER — Encounter (HOSPITAL_COMMUNITY): Payer: Self-pay | Admitting: Dentistry

## 2021-12-18 HISTORY — PX: SHOULDER ACROMIOPLASTY: SHX6093

## 2022-01-30 ENCOUNTER — Emergency Department (HOSPITAL_BASED_OUTPATIENT_CLINIC_OR_DEPARTMENT_OTHER): Payer: Medicare Other

## 2022-01-30 ENCOUNTER — Other Ambulatory Visit: Payer: Self-pay

## 2022-01-30 ENCOUNTER — Emergency Department (HOSPITAL_BASED_OUTPATIENT_CLINIC_OR_DEPARTMENT_OTHER)
Admission: EM | Admit: 2022-01-30 | Discharge: 2022-01-30 | Disposition: A | Discharge status: Skilled Nursing Facility | Payer: Medicare Other | Attending: Emergency Medicine | Admitting: Emergency Medicine

## 2022-01-30 DIAGNOSIS — Y93E1 Activity, personal bathing and showering: Secondary | ICD-10-CM | POA: Diagnosis not present

## 2022-01-30 DIAGNOSIS — S42322A Displaced transverse fracture of shaft of humerus, left arm, initial encounter for closed fracture: Secondary | ICD-10-CM | POA: Diagnosis not present

## 2022-01-30 DIAGNOSIS — M79602 Pain in left arm: Secondary | ICD-10-CM | POA: Diagnosis present

## 2022-01-30 DIAGNOSIS — Z79899 Other long term (current) drug therapy: Secondary | ICD-10-CM | POA: Diagnosis not present

## 2022-01-30 DIAGNOSIS — Y92129 Unspecified place in nursing home as the place of occurrence of the external cause: Secondary | ICD-10-CM | POA: Diagnosis not present

## 2022-01-30 DIAGNOSIS — W182XXA Fall in (into) shower or empty bathtub, initial encounter: Secondary | ICD-10-CM | POA: Diagnosis not present

## 2022-01-30 DIAGNOSIS — E039 Hypothyroidism, unspecified: Secondary | ICD-10-CM | POA: Insufficient documentation

## 2022-01-30 DIAGNOSIS — F840 Autistic disorder: Secondary | ICD-10-CM | POA: Diagnosis not present

## 2022-01-30 MED ORDER — MIDAZOLAM HCL 2 MG/2ML IJ SOLN
4.0000 mg | Freq: Once | INTRAMUSCULAR | Status: AC
Start: 2022-01-30 — End: 2022-01-30
  Administered 2022-01-30: 4 mg via INTRAMUSCULAR
  Filled 2022-01-30: qty 4

## 2022-01-30 MED ORDER — OXYCODONE HCL 5 MG PO TABS
5.0000 mg | ORAL_TABLET | Freq: Once | ORAL | Status: AC
  Administered 2022-01-30: 5 mg via ORAL
  Filled 2022-01-30: qty 1

## 2022-01-30 MED ORDER — ACETAMINOPHEN 500 MG PO TABS
1000.0000 mg | ORAL_TABLET | Freq: Once | ORAL | Status: AC
Start: 2022-01-30 — End: 2022-01-30
  Administered 2022-01-30: 1000 mg via ORAL
  Filled 2022-01-30: qty 2

## 2022-01-30 NOTE — ED Triage Notes (Addendum)
Patient arrives with complaints of left arm injury due to a fall this morning after getting out of the shower.   Patient has an cognitive delay and is accompanied by caregivers from a group home.

## 2022-01-30 NOTE — ED Provider Notes (Signed)
[facility] EMERGENCY DEPT Provider Note  CSN: [id number] Arrival date & time: 01/30/22 1317  Chief Complaint(s) Fall and Arm Injury (Left)  HPI Philip Vazquez is a 51 y.o. male history of intellectual disability, seizure disorder, autism presenting with left arm pain.  Patient slipped after getting out of shower this morning and was seen to have injured his left arm.  There was an obvious deformity so he was brought to the emergency department.  Caregivers have noticed he is not moving his arm as much.  History otherwise limited as patient nonverbal   Past Medical History Past Medical History:  Diagnosis Date   Anxiety    Autism    Constipation    Eczema    Hypothyroidism    Mental retardation    Myopia    Seizure (HCC)    last seizure was in summer 2022 as of 04/29/21 per Victorino Dike @ [facility]   Seizure disorder [facility])    Shortness of breath    no current problem as of 04/29/21 per [facility] at [facility]   Patient Active Problem List   Diagnosis Date Noted   Discolored nails 11/28/2012   Home Medication(s) Prior to Admission medications   Medication Sig Start Date End Date Taking? Authorizing Provider  bisacodyl 5 MG EC tablet Take 5 mg by mouth every Monday, Wednesday, and Friday. (2000)    [provider]  carbamazepine (TEGRETOL) 100 MG chewable tablet Chew 300 mg by mouth 2 (two) times daily. (0800 & 1600)    [provider]  carbamazepine (TEGRETOL) 200 MG tablet Take 400 mg by mouth at bedtime. (2000)    [provider]  Cholecalciferol (VITAMIN D3) 50 MCG (2000 UT) TABS Take 4,000 Units by mouth in the morning. (0800)    [provider]  clonazePAM (KLONOPIN) 1 MG tablet Take 1 mg by mouth 4 (four) times daily. (0800, 1200, 1600 & 2000)    [provider]  diphenhydrAMINE (BENADRYL) 25 MG tablet Take 25 mg by mouth at bedtime as needed (insomnia). (2000)    [provider]  EPINEPHrine 0.3 mg/0.3 mL IJ SOAJ  injection Inject 0.3 mg into the muscle as needed for anaphylaxis (allergic reaction to bee stings).    [provider]  gabapentin (NEURONTIN) 800 MG tablet Take 1 tablet (800 mg total) by mouth 3 (three) times daily. 02/03/13   [name], MD  haloperidol (HALDOL) 1 MG tablet Take 1 mg by mouth in the morning. (0800)    [provider]  hydrOXYzine (VISTARIL) 50 MG capsule Take 50 mg by mouth See admin instructions. Take 1 capsule (50 mg) by mouth 1 hour prior to lab draws/procedures    [provider]  levothyroxine (SYNTHROID) 25 MCG tablet Take 25 mcg by mouth daily at 6 (six) AM. (0630)    [provider]  linaclotide (LINZESS) 145 MCG CAPS capsule Take 145 mcg by mouth daily before breakfast. (0800)    [provider]  LORazepam (ATIVAN) 1 MG tablet Take 1-2 mg by mouth See admin instructions. Take 1 tablet (1 mg) by mouth as needed for behaviors not controlled by BSP>10 minutes. May repeat for 2 but not to exceed 3 mg/24hrs.    [provider]  niacin (SLO-NIACIN) 500 MG tablet Take 500 mg by mouth in the morning. (0800)    [provider]  OLANZapine (ZYPREXA) 10 MG tablet Take 10 mg by mouth in the morning. (0800)    [provider]  OLANZapine (  ZYPREXA) 15 MG tablet Take 15 mg by mouth at bedtime. (2000)    [provider]  oral electrolytes (THERMOTABS) TABS tablet Take 2 tablets by mouth daily. (0800)    [provider]  simvastatin (ZOCOR) 20 MG tablet Take 30 mg by mouth at bedtime. (2000)    [provider]  triazolam (HALCION) 0.125 MG tablet Take 0.375 mg by mouth See admin instructions. Take 3 tablets (0.375 mg) by mouth prior to dental procedure as directed by the access dental care staff    [provider]                                                                                                                                    Past Surgical History Past Surgical  History:  Procedure Laterality Date   DENTAL RESTORATION/EXTRACTION WITH X-RAY N/A 03/02/2013   Procedure: RECALL DENTAL EXAMINATION AND CLEANING; FULL MOUTH X-RAYS' FULL MOUTH DEBRIDEMENT; EXTRACTION OF TEETH #1, 16, 30; COMPOSITE FILLINGS OF TEETH #7, 8, 9, 10;  Surgeon: Esaw Dace., DDS;  Location: [facility] OR;  Service: Oral Surgery;  Laterality: N/A;   DENTAL RESTORATION/EXTRACTION WITH X-RAY N/A 04/30/2021   Procedure: DENTAL RESTORATION/EXTRACTION WITH X-RAY. #5, #17 surgical extractions. Fillings on 50F, 51F, 31F, 9DLF, 10DF, 11DF, 31F, 28DOB, 32 B;  Surgeon: Joanna Hews, DMD;  Location: [facility] OR;  Service: Dentistry;  Laterality: N/A;   Family History No family history on file.  Social History Social History   Tobacco Use   Smoking status: Never   Smokeless tobacco: Never  Vaping Use   Vaping Use: Never used  Substance Use Topics   Alcohol use: No   Drug use: No   Allergies Bee venom  Review of Systems Review of Systems  All other systems reviewed and are negative.   Physical Exam Vital Signs  I have reviewed the triage vital signs BP (!) 144/100   Pulse 85   Temp 98.2 F (36.8 C) (Temporal)   Resp 20   Ht 5\' 9"  (1.753 m)   Wt 73 kg   SpO2 94%   BMI 23.77 kg/m  Physical Exam Vitals and nursing note reviewed.  Constitutional:      General: He is not in acute distress. HENT:     Head: Normocephalic and atraumatic.     Mouth/Throat:     Mouth: Mucous membranes are moist.  Eyes:     Conjunctiva/sclera: Conjunctivae normal.  Cardiovascular:     Rate and Rhythm: Normal rate and regular rhythm.  Pulmonary:     Effort: Pulmonary effort is normal. No respiratory distress.     Breath sounds: Normal breath sounds.  Abdominal:     General: Abdomen is flat.     Palpations: Abdomen is soft.     Tenderness: There is no abdominal tenderness.  Musculoskeletal:     Cervical back: No tenderness.     Right lower leg:  No edema.     Left lower leg: No edema.      Comments: Full range of motion of the right upper and bilateral lower extremities.  Obvious deformity to the mid upper arm.  Difficult to assess elbow but patient does not seem to have focal tenderness, patient not following commands.  2+ radial pulses bilaterally.  Patient does not follow commands so difficult to assess distal radial nerve and remainder of neurologic exam but does withdraw from pain when pinching the radial nerve distribution of the left hand.  Skin:    General: Skin is warm and dry.     Capillary Refill: Capillary refill takes less than 2 seconds.  Neurological:     Mental Status: He is alert. Mental status is at baseline.     Comments: Moves all 4 extremities spontaneously although limited movement of the left arm due to obvious deformity.  No facial droop.  Per caregivers, at baseline, making incomprehensible noises  Psychiatric:        Mood and Affect: Mood normal.     ED Results and Treatments Labs (all labs ordered are listed, but only abnormal results are displayed) Labs Reviewed - No data to display                                                                                                                        Radiology DG Elbow 2 Views Left  Result Date: 01/30/2022 CLINICAL DATA:  Fall.  Left arm pain and deformity. EXAM: LEFT ELBOW - 2 VIEW COMPARISON:  None Available. FINDINGS: Single view of the left elbow. No fracture or bone lesion. Elbow joint appears normally aligned. No convincing joint effusion. IMPRESSION: Negative. Electronically Signed   By: Lajean Manes M.D.   On: 01/30/2022 15:02   DG Shoulder Left  Result Date: 01/30/2022 CLINICAL DATA:  Fall.  Pain. EXAM: LEFT SHOULDER - 2+ VIEW COMPARISON:  None Available. FINDINGS: No left shoulder fracture. Glenohumeral and AC joints are normally aligned. Left humeral diaphysis fracture, described under the left humerus radiographs. IMPRESSION: No left shoulder fracture or dislocation. Electronically  Signed   By: Lajean Manes M.D.   On: 01/30/2022 15:01   DG Humerus Left  Result Date: 01/30/2022 CLINICAL DATA:  Fall.  Left arm pain and deformity. EXAM: LEFT HUMERUS - 2+ VIEW COMPARISON:  None Available. FINDINGS: Oblique midshaft left humeral fracture. Fracture is displaced, distal fracture component 1.6 cm medial to the proximal component. Fracture is mildly overlapped/foreshortened, proximally 1.2 cm. There is also mild angulation of approximately 20 degrees, apex lateral. No other fractures.  No bone lesions. Shoulder and elbow joints are normally aligned. Mid arm soft tissue swelling. IMPRESSION: Displaced, oblique fracture of the left mid humeral diaphysis associated with mild angulation. No significant comminution. Electronically Signed   By: Lajean Manes M.D.   On: 01/30/2022 15:00    Pertinent labs & imaging results that were available during my care of the patient were reviewed by me and  considered in my medical decision making (see MDM for details).  Medications Ordered in ED Medications  acetaminophen (TYLENOL) tablet 1,000 mg (1,000 mg Oral Given 01/30/22 1418)  oxyCODONE (Oxy IR/ROXICODONE) immediate release tablet 5 mg (5 mg Oral Given 01/30/22 1418)  midazolam (VERSED) injection 4 mg (4 mg Intramuscular Given 01/30/22 1549)                                                                                                                                     Procedures .Ortho Injury Treatment  Date/Time: 01/30/2022 4:39 PM  Performed by: Lonell Grandchild, MD Authorized by: Lonell Grandchild, MD   Consent:    Consent obtained:  Verbal   Consent given by:  Healthcare agent   Risks discussed:  Fracture, nerve damage, stiffness and vascular damage   Alternatives discussed:  No treatment, referral and delayed treatmentInjury location: upper arm Location details: left upper arm Injury type: fracture Fracture type: humeral shaft Pre-procedure neurovascular assessment:  neurovascularly intact Pre-procedure distal perfusion: normal Pre-procedure neurological function comment: normal as well as can be assessed Pre-procedure range of motion: reduced  Anesthesia: Local anesthesia used: no  Patient sedated: NoManipulation performed: no Immobilization: splint Splint type: coaptation. Splint Applied by: ED Provider and ED Tech Supplies used: Ortho-Glass Post-procedure neurovascular assessment: post-procedure neurovascularly intact Post-procedure distal perfusion: normal Post-procedure neurological function comment: normal as well as can be assessed Post-procedure range of motion: normal     (including critical care time)  Medical Decision Making / ED Course   MDM:  51 year old male with fall onto the left arm.  Patient has obvious midshaft left humeral fracture which is evident on x-ray.  Is difficult to tell whether he has a neurologic deficit but he does withdraw to pain when pinching his radial distribution on the left hand, so suspect this is intact.  He has 2+ radial pulses bilaterally.  He has no skin breakdown to suggest open fracture.  He has no external signs of trauma to the remainder of his body and no signs of head trauma.  He is freely moving his other extremities.  Splint placement was accomplished after giving pain control and small amount of Versed.  I discussed the fracture with Dr. Odis Hollingshead of orthopedic surgery, including the patient's intellectual disability, his caregivers are concerned that he will not be able to keep the splint on.  This was discussed with orthopedic surgeon who reports that this does not change his opinion that the patient needs to follow-up as an outpatient and splint should be attempted.  Will discharge patient to home. All questions answered. Caregiver comfortable with plan of discharge. Return precautions discussed with caregivers and specified on the after visit summary.       Additional history  obtained: -Additional history obtained from caregiver -External records from outside source obtained and reviewed including: Chart review including previous notes, labs, imaging, consultation notes including fall 08/31/2017  Imaging Studies ordered: I ordered imaging studies including XR left shoulder, left humerus, left elbow On my interpretation imaging demonstrates obvious midshaft humeral fracture I independently visualized and interpreted imaging. I agree with the radiologist interpretation   Medicines ordered and prescription drug management: Meds ordered this encounter  Medications   acetaminophen (TYLENOL) tablet 1,000 mg   oxyCODONE (Oxy IR/ROXICODONE) immediate release tablet 5 mg   midazolam (VERSED) injection 4 mg    -I have reviewed the patients home medicines and have made adjustments as needed   Consultations Obtained: I requested consultation with the repeated surgeon Dr.Ramanathan,  and discussed lab and imaging findings as well as pertinent plan - they recommend: coaptation splint and follow up with Dr. Aundria Rudogers   Cardiac Monitoring: The patient was maintained on a cardiac monitor.  I personally viewed and interpreted the cardiac monitored which showed an underlying rhythm of: NSR  Social Determinants of Health:  Diagnosis or treatment significantly limited by social determinants of health: intellectual disability   Reevaluation: After the interventions noted above, I reevaluated the patient and found that they have improved  Co morbidities that complicate the patient evaluation  Past Medical History:  Diagnosis Date   Anxiety    Autism    Constipation    Eczema    Hypothyroidism    Mental retardation    Myopia    Seizure (HCC)    last seizure was in summer 2022 as of 04/29/21 per Victorino DikeJennifer @ [facility]   Seizure disorder [facility](HCC)    Shortness of breath    no current problem as of 04/29/[facility]      Dispostion: Disposition decision including  need for hospitalization was considered, and patient discharged from emergency department.    Final Clinical Impression(s) / ED Diagnoses Final diagnoses:  Closed displaced transverse fracture of shaft of left humerus, initial encounter     This chart was dictated using voice recognition software.  Despite best efforts to proofread,  errors can occur which can change the documentation meaning.    Lonell GrandchildScheving, Anael Rosch L, MD 01/30/22 [id number]

## 2022-01-30 NOTE — Discharge Instructions (Addendum)
We evaluated Randall Hiss for his arm pain.  His x-rays showed a broken humerus, the upper part of the arm.  We placed him in a splint.  Please try to keep the splint in place and keep it from getting wet.  We have also provided a sling.    Please give Khyre Tylenol and Motrin for pain.  He can take 650 mg of Tylenol every 6 hours and 600 mg of ibuprofen every 6 hours as needed for his symptoms.  He can take these medicines together as needed, either at the same time, or alternating every 3 hours.  If there are any problems with the sling, he develops any color change in his arm, he has increased pain, or he develops any wounds on his skin, please bring him back to the emergency department.  Call Dr. Stann Mainland of orthopedic surgery to schedule follow-up appointment.

## 2022-02-17 ENCOUNTER — Encounter (HOSPITAL_COMMUNITY): Payer: Self-pay | Admitting: Orthopedic Surgery

## 2022-02-17 NOTE — Progress Notes (Signed)
S.D.W- Instructions   Your procedure is scheduled on Thurs., Nov. 2, 2023 from 5:30PM-7:00PM.  Report to [facility] Main Entrance "A" at 3:00 P.M., then check in with the Admitting office.  Call this number if you have problems the morning of surgery:  [phone]   Remember:  Do not eat after midnight on Nov. 1st  You may drink clear liquids until 3 hours (2:30PM) prior to surgery time the morning of your surgery.   Clear liquids allowed are: Water, Non-Citrus Juices (without pulp), Carbonated Beverages, Clear Tea, Black Coffee ONLY (NO MILK, CREAM OR POWDERED CREAMER of any kind), and Gatorade    Take these medicines the morning of surgery with A SIP OF WATER: Please provide the exact times the medicines were given for anesthesia accuracy. Carbamazepine (TEGRETOL) ClonazePAM (KLONOPIN) Gabapentin (NEURONTIN) Haloperidol (HALDOL) Levothyroxine (SYNTHROID)  Linaclotide (LINZESS) OLANZapine (ZYPREXA)  If Needed: Acetaminophen (TYLENOL) LORazepam (ATIVAN)  As of today, STOP taking any Aspirin (unless otherwise instructed by your surgeon) Aleve, Naproxen, Ibuprofen, Motrin, Advil, Goody's, BC's, all herbal medications, fish oil, and all vitamins.          Do not wear jewelry. Do not wear lotions, powders, cologne or deodorant. Do not shave 48 hours prior to surgery.  Men may shave face and neck. Do not bring valuables to the hospital.  [facility] is not responsible for any belongings or valuables.    Do NOT Smoke (Tobacco/Vaping)  24 hours prior to your procedure  If you use a CPAP at night, you may bring your mask for your overnight stay.   Contacts, glasses, hearing aids, dentures or partials may not be worn into surgery, please bring cases for these belongings   For patients admitted to the hospital, discharge time will be determined by your treatment team.   Patients discharged the day of surgery will not be allowed to drive home, and someone needs to stay with them for  24 hours.  Special instructions:    Oral Hygiene is also important to reduce your risk of infection.  Remember - BRUSH YOUR TEETH THE MORNING OF SURGERY WITH YOUR REGULAR TOOTHPASTE  [place]- Preparing For Surgery  Before surgery, you can play an important role. Because skin is not sterile, your skin needs to be as free of germs as possible. You can reduce the number of germs on your skin by washing with Antibacterial Soap before surgery.     Please follow these instructions carefully.     Shower the NIGHT BEFORE SURGERY and the MORNING OF SURGERY with Antibacterial Soap.   Pat yourself dry with a CLEAN TOWEL.  Wear CLEAN PAJAMAS to bed the night before surgery  Place CLEAN SHEETS on your bed the night before your surgery  DO NOT SLEEP WITH PETS.  Day of Surgery:  Take a shower with Antibacterial soap. Wear Clean/Comfortable clothing the morning of surgery Do not apply any deodorants/lotions.   Remember to brush your teeth WITH YOUR REGULAR TOOTHPASTE.   If you test positive for Covid, or been in contact with anyone that has tested positive in the last 10 days, please notify your surgeon.  SURGICAL WAITING ROOM VISITATION Patients having surgery or a procedure may have no more than 2 support people in the waiting area - these visitors may rotate.   Children under the age of 67 must have an adult with them who is not the patient. If the patient needs to stay at the hospital during part of their recovery, the visitor  guidelines for inpatient rooms apply. Pre-op nurse will coordinate an appropriate time for 1 support person to accompany patient in pre-op.  This support person may not rotate.   Please refer to the [facility] website for the visitor guidelines for Inpatients (after your surgery is over and you are in a regular room).

## 2022-02-17 NOTE — Progress Notes (Signed)
Spoke with Anderson Malta, RN from [facility] [phone] and faxed pre-op instructions to her [phone]. Awaiting confirmation. She denies the pt havign symptoms of Covid or Flu or having to be tested within the last 2 weeks.

## 2022-02-18 ENCOUNTER — Other Ambulatory Visit: Payer: Self-pay

## 2022-02-18 ENCOUNTER — Ambulatory Visit (HOSPITAL_COMMUNITY)
Admission: RE | Admit: 2022-02-18 | Discharge: 2022-02-18 | Disposition: A | Discharge status: Home / Self Care | Payer: Medicare Other | Attending: Orthopedic Surgery | Admitting: Orthopedic Surgery

## 2022-02-18 ENCOUNTER — Ambulatory Visit (HOSPITAL_COMMUNITY): Payer: Medicare Other | Admitting: Physician Assistant

## 2022-02-18 ENCOUNTER — Encounter (HOSPITAL_COMMUNITY)
Admission: RE | Disposition: A | Discharge status: Home / Self Care | Payer: Self-pay | Source: Home / Self Care | Attending: Orthopedic Surgery

## 2022-02-18 ENCOUNTER — Encounter (HOSPITAL_COMMUNITY): Payer: Self-pay | Admitting: Orthopedic Surgery

## 2022-02-18 ENCOUNTER — Ambulatory Visit (HOSPITAL_COMMUNITY): Payer: Medicare Other

## 2022-02-18 ENCOUNTER — Ambulatory Visit (HOSPITAL_BASED_OUTPATIENT_CLINIC_OR_DEPARTMENT_OTHER): Payer: Medicare Other | Admitting: Physician Assistant

## 2022-02-18 DIAGNOSIS — F840 Autistic disorder: Secondary | ICD-10-CM | POA: Insufficient documentation

## 2022-02-18 DIAGNOSIS — F79 Unspecified intellectual disabilities: Secondary | ICD-10-CM | POA: Insufficient documentation

## 2022-02-18 DIAGNOSIS — S42302A Unspecified fracture of shaft of humerus, left arm, initial encounter for closed fracture: Secondary | ICD-10-CM

## 2022-02-18 DIAGNOSIS — W182XXA Fall in (into) shower or empty bathtub, initial encounter: Secondary | ICD-10-CM | POA: Diagnosis not present

## 2022-02-18 DIAGNOSIS — G40909 Epilepsy, unspecified, not intractable, without status epilepticus: Secondary | ICD-10-CM | POA: Insufficient documentation

## 2022-02-18 HISTORY — PX: HUMERUS IM NAIL: SHX1769

## 2022-02-18 SURGERY — INSERTION, INTRAMEDULLARY ROD, HUMERUS
Anesthesia: General | Laterality: Left

## 2022-02-18 MED ORDER — SUGAMMADEX SODIUM 200 MG/2ML IV SOLN
INTRAVENOUS | Status: DC | PRN
  Administered 2022-02-18: 200 mg via INTRAVENOUS

## 2022-02-18 MED ORDER — DEXMEDETOMIDINE HCL IN NACL 80 MCG/20ML IV SOLN
INTRAVENOUS | Status: DC | PRN
  Administered 2022-02-18: 8 ug via BUCCAL

## 2022-02-18 MED ORDER — KETOROLAC TROMETHAMINE 30 MG/ML IJ SOLN: 30.0000 mg | Freq: Once | INTRAMUSCULAR | Status: DC | PRN

## 2022-02-18 MED ORDER — DEXAMETHASONE SODIUM PHOSPHATE 10 MG/ML IJ SOLN
INTRAMUSCULAR | Status: AC
  Filled 2022-02-18: qty 1

## 2022-02-18 MED ORDER — PHENYLEPHRINE 80 MCG/ML (10ML) SYRINGE FOR IV PUSH (FOR BLOOD PRESSURE SUPPORT)
PREFILLED_SYRINGE | INTRAVENOUS | Status: DC | PRN
  Administered 2022-02-18: 80 ug via INTRAVENOUS

## 2022-02-18 MED ORDER — PROPOFOL 10 MG/ML IV BOLUS
INTRAVENOUS | Status: DC | PRN
  Administered 2022-02-18: 60 mg via INTRAVENOUS
  Administered 2022-02-18: 50 mg via INTRAVENOUS

## 2022-02-18 MED ORDER — ONDANSETRON HCL 4 MG/2ML IJ SOLN: 4.0000 mg | Freq: Once | INTRAMUSCULAR | Status: DC | PRN

## 2022-02-18 MED ORDER — OXYCODONE HCL 5 MG PO TABS: 5.0000 mg | ORAL_TABLET | Freq: Once | ORAL | Status: DC | PRN

## 2022-02-18 MED ORDER — ROCURONIUM BROMIDE 10 MG/ML (PF) SYRINGE
PREFILLED_SYRINGE | INTRAVENOUS | Status: DC | PRN
  Administered 2022-02-18: 70 mg via INTRAVENOUS

## 2022-02-18 MED ORDER — ONDANSETRON HCL 4 MG/2ML IJ SOLN
INTRAMUSCULAR | Status: AC
  Filled 2022-02-18: qty 2

## 2022-02-18 MED ORDER — MIDAZOLAM HCL 2 MG/ML PO SYRP: 14.0000 mg | ORAL_SOLUTION | ORAL | Status: AC

## 2022-02-18 MED ORDER — DEXAMETHASONE SODIUM PHOSPHATE 10 MG/ML IJ SOLN
INTRAMUSCULAR | Status: DC | PRN
  Administered 2022-02-18: 10 mg via INTRAVENOUS

## 2022-02-18 MED ORDER — MIDAZOLAM HCL 2 MG/ML PO SYRP
ORAL_SOLUTION | ORAL | Status: AC
  Administered 2022-02-18: 14 mg via ORAL
  Filled 2022-02-18: qty 10

## 2022-02-18 MED ORDER — KETAMINE HCL 100 MG/ML IJ SOLN
INTRAMUSCULAR | Status: DC | PRN
  Administered 2022-02-18: 400 mg via INTRAMUSCULAR

## 2022-02-18 MED ORDER — ACETAMINOPHEN 10 MG/ML IV SOLN
INTRAVENOUS | Status: AC
  Filled 2022-02-18: qty 100

## 2022-02-18 MED ORDER — 0.9 % SODIUM CHLORIDE (POUR BTL) OPTIME
TOPICAL | Status: DC | PRN
  Administered 2022-02-18: 1000 mL

## 2022-02-18 MED ORDER — GLYCOPYRROLATE PF 0.2 MG/ML IJ SOSY
PREFILLED_SYRINGE | INTRAMUSCULAR | Status: DC | PRN
  Administered 2022-02-18: .2 mg via INTRAVENOUS

## 2022-02-18 MED ORDER — BUPIVACAINE-EPINEPHRINE 0.25% -1:200000 IJ SOLN
INTRAMUSCULAR | Status: DC | PRN
  Administered 2022-02-18: 30 mL

## 2022-02-18 MED ORDER — ACETAMINOPHEN 10 MG/ML IV SOLN
INTRAVENOUS | Status: DC | PRN
  Administered 2022-02-18: 1000 mg via INTRAVENOUS

## 2022-02-18 MED ORDER — ROCURONIUM BROMIDE 10 MG/ML (PF) SYRINGE
PREFILLED_SYRINGE | INTRAVENOUS | Status: AC
  Filled 2022-02-18: qty 10

## 2022-02-18 MED ORDER — KETAMINE HCL 50 MG/ML IJ SOLN: INTRAMUSCULAR | Status: DC | PRN

## 2022-02-18 MED ORDER — OXYCODONE HCL 5 MG/5ML PO SOLN: 5.0000 mg | Freq: Once | ORAL | Status: DC | PRN

## 2022-02-18 MED ORDER — CEFAZOLIN SODIUM-DEXTROSE 2-4 GM/100ML-% IV SOLN
2.0000 g | INTRAVENOUS | Status: AC
  Administered 2022-02-18: 2 g via INTRAVENOUS
  Filled 2022-02-18: qty 100

## 2022-02-18 MED ORDER — PHENYLEPHRINE 80 MCG/ML (10ML) SYRINGE FOR IV PUSH (FOR BLOOD PRESSURE SUPPORT)
PREFILLED_SYRINGE | INTRAVENOUS | Status: AC
  Filled 2022-02-18: qty 10

## 2022-02-18 MED ORDER — LACTATED RINGERS IV SOLN: INTRAVENOUS | Status: DC | PRN

## 2022-02-18 MED ORDER — PROPOFOL 10 MG/ML IV BOLUS
INTRAVENOUS | Status: AC
  Filled 2022-02-18: qty 20

## 2022-02-18 MED ORDER — KETAMINE HCL 100 MG/ML IJ SOLN
INTRAMUSCULAR | Status: AC
  Filled 2022-02-18: qty 1

## 2022-02-18 MED ORDER — HYDROMORPHONE HCL 1 MG/ML IJ SOLN: 0.2500 mg | INTRAMUSCULAR | Status: DC | PRN

## 2022-02-18 MED ORDER — BUPIVACAINE-EPINEPHRINE (PF) 0.25% -1:200000 IJ SOLN
INTRAMUSCULAR | Status: AC
  Filled 2022-02-18: qty 30

## 2022-02-18 MED ORDER — FENTANYL CITRATE (PF) 250 MCG/5ML IJ SOLN
INTRAMUSCULAR | Status: DC | PRN
  Administered 2022-02-18 (×3): 50 ug via INTRAVENOUS

## 2022-02-18 MED ORDER — GLYCOPYRROLATE PF 0.2 MG/ML IJ SOSY
PREFILLED_SYRINGE | INTRAMUSCULAR | Status: AC
  Filled 2022-02-18: qty 1

## 2022-02-18 MED ORDER — FENTANYL CITRATE (PF) 250 MCG/5ML IJ SOLN
INTRAMUSCULAR | Status: AC
  Filled 2022-02-18: qty 5

## 2022-02-18 MED ORDER — ONDANSETRON HCL 4 MG/2ML IJ SOLN
INTRAMUSCULAR | Status: DC | PRN
  Administered 2022-02-18: 4 mg via INTRAVENOUS

## 2022-02-18 SURGICAL SUPPLY — 58 items
ADH SKN CLS APL DERMABOND .7 (GAUZE/BANDAGES/DRESSINGS) ×1
BAG COUNTER SPONGE SURGICOUNT (BAG) IMPLANT
BIT DRILL 10 HOLLOW (BIT) IMPLANT
BIT DRILL 3.8X270 (BIT) IMPLANT
BIT DRILL SHORT 3.2MM (DRILL) IMPLANT
CHLORAPREP W/TINT 26 (MISCELLANEOUS) ×1 IMPLANT
COVER SURGICAL LIGHT HANDLE (MISCELLANEOUS) ×1 IMPLANT
DERMABOND ADVANCED .7 DNX12 (GAUZE/BANDAGES/DRESSINGS) IMPLANT
DRAPE C-ARM 42X72 X-RAY (DRAPES) IMPLANT
DRAPE IMP U-DRAPE 54X76 (DRAPES) IMPLANT
DRAPE INCISE IOBAN 66X45 STRL (DRAPES) ×1 IMPLANT
DRAPE ORTHO SPLIT 77X108 STRL (DRAPES) ×2
DRAPE SURG ORHT 6 SPLT 77X108 (DRAPES) ×2 IMPLANT
DRAPE U-SHAPE 47X51 STRL (DRAPES) ×1 IMPLANT
DRILL SHORT 3.2MM (DRILL) ×1
DRSG ADAPTIC 3X8 NADH LF (GAUZE/BANDAGES/DRESSINGS) ×1 IMPLANT
ELECT REM PT RETURN 9FT ADLT (ELECTROSURGICAL) ×1
ELECTRODE REM PT RTRN 9FT ADLT (ELECTROSURGICAL) ×1 IMPLANT
GAUZE SPONGE 4X4 12PLY STRL LF (GAUZE/BANDAGES/DRESSINGS) IMPLANT
GLOVE BIO SURGEON STRL SZ7.5 (GLOVE) ×2 IMPLANT
GLOVE BIOGEL PI IND STRL 8 (GLOVE) ×2 IMPLANT
GOWN STRL REUS W/ TWL LRG LVL3 (GOWN DISPOSABLE) ×1 IMPLANT
GOWN STRL REUS W/ TWL XL LVL3 (GOWN DISPOSABLE) ×2 IMPLANT
GOWN STRL REUS W/TWL LRG LVL3 (GOWN DISPOSABLE) ×1
GOWN STRL REUS W/TWL XL LVL3 (GOWN DISPOSABLE) ×2
GUIDEROD SS 2.5MMX280MM (ROD) IMPLANT
K-WIRE TROCAR POINT 2.5X280 (WIRE) ×1
KIT BASIN OR (CUSTOM PROCEDURE TRAY) ×1 IMPLANT
KIT TURNOVER KIT B (KITS) ×1 IMPLANT
KWIRE TROCAR POINT 2.5X280 (WIRE) IMPLANT
MANIFOLD NEPTUNE II (INSTRUMENTS) ×1 IMPLANT
NAIL HUM MULTILOC 7X270 LG (Nail) IMPLANT
NS IRRIG 1000ML POUR BTL (IV SOLUTION) ×1 IMPLANT
PACK SHOULDER (CUSTOM PROCEDURE TRAY) ×1 IMPLANT
PAD ARMBOARD 7.5X6 YLW CONV (MISCELLANEOUS) ×2 IMPLANT
ROD REAMING 2.5 (INSTRUMENTS) IMPLANT
SCREW LOCK 4.5X44 (Screw) IMPLANT
SCREW LOCK MULTILOC FT 4.5X46 (Screw) IMPLANT
SCREW LOCKING 4.0 28MM (Screw) IMPLANT
SLING ARM FOAM STRAP LRG (SOFTGOODS) IMPLANT
SLING ARM FOAM STRAP MED (SOFTGOODS) IMPLANT
SPONGE T-LAP 4X18 ~~LOC~~+RFID (SPONGE) IMPLANT
STRIP CLOSURE SKIN 1/2X4 (GAUZE/BANDAGES/DRESSINGS) IMPLANT
SUCTION FRAZIER HANDLE 10FR (MISCELLANEOUS) ×1
SUCTION TUBE FRAZIER 10FR DISP (MISCELLANEOUS) ×1 IMPLANT
SUT FIBERWIRE #2 38 T-5 BLUE (SUTURE)
SUT MNCRL AB 3-0 PS2 27 (SUTURE) IMPLANT
SUT MNCRL AB 4-0 PS2 18 (SUTURE) IMPLANT
SUT VIC AB 1 CT1 27 (SUTURE) ×2
SUT VIC AB 1 CT1 27XBRD ANBCTR (SUTURE) IMPLANT
SUT VIC AB 2-0 CT1 27 (SUTURE) ×2
SUT VIC AB 2-0 CT1 TAPERPNT 27 (SUTURE) ×1 IMPLANT
SUTURE FIBERWR #2 38 T-5 BLUE (SUTURE) IMPLANT
SYR CONTROL 10ML LL (SYRINGE) IMPLANT
TOWEL GREEN STERILE (TOWEL DISPOSABLE) ×1 IMPLANT
TOWEL GREEN STERILE FF (TOWEL DISPOSABLE) ×1 IMPLANT
WATER STERILE IRR 1000ML POUR (IV SOLUTION) ×1 IMPLANT
YANKAUER SUCT BULB TIP NO VENT (SUCTIONS) ×1 IMPLANT

## 2022-02-18 NOTE — Progress Notes (Signed)
Questioned group home staff about who signs consent for patient. Staff stated they did. Attempted to call brother, legal guardian, three times but unable to reach him and unable to leave a message. Called mother and left message. No return call. Will make MD aware when he arrives.

## 2022-02-18 NOTE — Brief Op Note (Signed)
02/18/2022  7:18 PM  PATIENT:  Philip Vazquez  51 y.o. male  PRE-OPERATIVE DIAGNOSIS:  Left humerus midshaft fracture  POST-OPERATIVE DIAGNOSIS:  Left humerus midshaft fracture  PROCEDURE:  Procedure(s) with comments: INTRAMEDULLARY (IM) NAIL HUMERUS (Left) - 90 requesting add on room 5:30pm  SURGEON:  Surgeon(s) and Role:    * Stann Mainland, Elly Modena, MD - Primary  PHYSICIAN ASSISTANT: Jonelle Sidle, PA-C  ANESTHESIA:   local and general  EBL:  100cc  BLOOD ADMINISTERED:none  DRAINS: none   LOCAL MEDICATIONS USED:  MARCAINE     SPECIMEN:  No Specimen  DISPOSITION OF SPECIMEN:  N/A  COUNTS:  YES  TOURNIQUET:  * No tourniquets in log *  DICTATION: .Note written in EPIC  PLAN OF CARE: Discharge to home after PACU  PATIENT DISPOSITION:  PACU - hemodynamically stable.   Delay start of Pharmacological VTE agent (>24hrs) due to surgical blood loss or risk of bleeding: not applicable

## 2022-02-18 NOTE — Transfer of Care (Signed)
Immediate Anesthesia Transfer of Care Note  Patient: DEWAN EMOND  Procedure(s) Performed: INTRAMEDULLARY (IM) NAIL HUMERUS (Left)  Patient Location: PACU  Anesthesia Type:General  Level of Consciousness: drowsy and patient cooperative  Airway & Oxygen Therapy: Patient Spontanous Breathing  Post-op Assessment: Report given to RN, Post -op Vital signs reviewed and stable, and Patient moving all extremities X 4  Post vital signs: Reviewed and stable  Last Vitals:  Vitals Value Taken Time  BP 154/90 02/18/22 2003  Temp    Pulse 94 02/18/22 2012  Resp 15 02/18/22 2012  SpO2 92 % 02/18/22 2012  Vitals shown include unvalidated device data.  Last Pain: There were no vitals filed for this visit.    Patients Stated Pain Goal: 0 (70/48/88 9169)  Complications: No notable events documented.

## 2022-02-18 NOTE — Op Note (Signed)
Date of Surgery: 02/18/2022   INDICATIONS: [name] is a 51 y.o.-year-old male with a left short oblique midshaft humerus fracture following a fall in the shower about 2 weeks ago.  Unfortunately, he has significant mental retardation and is nonverbal.  He is unable to comply with any type of restrictions and had significant concerns for impending open fracture conversion.  Also he would be a high risk for nonunion.  Here today for operative fixation.  Accompanied by his group home guardians.  They have provided informed consent, but unfortunately, we did not have any documentation to confirm their status as such.  Therefore I have deemed this an emergent consent case and and we have elected to proceed.  PREOPERATIVE DIAGNOSIS:  Left proximal humerus midshaft closed fracture     POSTOPERATIVE DIAGNOSIS: Same.   PROCEDURE: Intramedullary nail left humeral shaft for treatment of fracture.   SURGEON: Geralynn Rile, M.D.   ASSIST: Jonelle Sidle, PA-C   Assistant attestation:   PA McClung present for the entire procedure..   ANESTHESIA:  general with local   IV FLUIDS AND URINE: See anesthesia.   ESTIMATED BLOOD LOSS: 100 mL.   IMPLANTS: Synthes multi lock humeral nail 7 mm x 270 mm with 3 proximal locking screws as well as 1 distal interlock screw   DRAINS: None   COMPLICATIONS: None.   DESCRIPTION OF PROCEDURE: The patient was brought to the operating room and placed supine on the operating table.  The patient had been signed prior to the procedure and this was documented. The patient had the anesthesia placed by the anesthesiologist.  A time-out was performed to confirm that this was the correct patient, site, side and location. The patient did receive antibiotics prior to the incision and was re-dosed during the procedure as needed at indicated intervals.  A tourniquet was not placed.  The patient had the operative extremity prepped and draped in the standard surgical fashion.       We began the procedure with an anterior lateral approach to the proximal humerus.  We divided the skin and subcutaneous tissue down to the deltoid musculature.  We split the raphae between the anterior deltoid and middle deltoid.  We then developed the subdeltoid and subacromial space bluntly.  We identified the rotator cuff which was intact anteriorly.  Superior cuff also intact.  We then used intraoperative fluoroscopy to identify a good starting point on the apex of the humeral head.  Once we identified this we then split sharply the supraspinatus tendon in line with the fibers.  We then accessed the proximal humerus with opening reamer.  We then reduced the fracture with closed maneuvers..  We then reamed down the humeral shaft up to the appropriate size with a 7 mm nail.  We then [facility] the nail into place.  We placed 3 multi lock proximal screws into the humeral metaphysis and humeral head which were unicortical.  We then moved down to the distal humerus to apply our interlock screw.  Utilizing perfect circles with intraoperative fluoroscopy we identified the distal interlocking slot.  We then drilled for the 4.0 mm interlocking screw.  We then measured and placed the final screw.  Intraoperative fluoroscopy was used throughout the procedure.  We identified that the nail length was appropriate and the reduction was adequate.  No noted intraoperative complications with her hardware.   We then moved to irrigate wounds.  We irrigated copiously with normal saline.  We then closed the deep fascia of  the deltoid in a similar manner with #1 figure-of-eight Vicryl.  Irrigated once again and then closed skin with 2-0 Vicryl for subcutaneous tissue and staples for the skin.  Standard sterile bandage was applied.   There were no noted intraoperative complications.  The arm was placed in a sling.  He was extubated and transported to PACU in stable condition.   POSTOPERATIVE PLAN:  [name] will be  discharged home today from PACU.Marland Kitchen  He can be out of his sling as tolerated and use the left arm immediately for all activities of daily living.  Sling will be just as needed for comfort only.  Apply ice liberally throughout the day.  I will see him back in the office in 2 weeks for wound check, and AP and lateral x-rays of the humerus.

## 2022-02-18 NOTE — Anesthesia Procedure Notes (Signed)
Procedure Name: Intubation Date/Time: 02/18/2022 6:00 PM  Performed by: Lorie Phenix, CRNAPre-anesthesia Checklist: Patient identified, Emergency Drugs available, Suction available and Patient being monitored Patient Re-evaluated:Patient Re-evaluated prior to induction Oxygen Delivery Method: Circle system utilized Preoxygenation: Pre-oxygenation with 100% oxygen Induction Type: IV induction Ventilation: Mask ventilation without difficulty and Oral airway inserted - appropriate to patient size Laryngoscope Size: Mac and 4 Grade View: Grade I Tube type: Oral Tube size: 7.5 mm Number of attempts: 1 Airway Equipment and Method: Stylet and Oral airway Placement Confirmation: ETT inserted through vocal cords under direct vision, positive ETCO2 and breath sounds checked- equal and bilateral Secured at: 23 cm Dental Injury: Teeth and Oropharynx as per pre-operative assessment

## 2022-02-18 NOTE — Discharge Instructions (Signed)
Orthopedic discharge instruction:  -maintain postoperative bandages until your follow-up appointment.  You may shower with these in place.  Do not submerge underwater.  -No sling necessary.  He may use the arm immediately for activities of daily living.  -For mild to moderate pain use Tylenol and Advil around-the-clock in alternating fashion.  -Apply ice liberally to the shoulder throughout the day 20 to 30 minutes at a time.  -Return to see Dr. Stann Mainland in the office in 2 weeks.

## 2022-02-18 NOTE — H&P (Signed)
ORTHOPAEDIC H and P  REQUESTING PHYSICIAN: Yolonda Kida, MD  PCP:  Gilda Crease, MD  Chief Complaint: Left humeral shaft fracture  HPI: Philip Vazquez is a 51 y.o. male who complains of significant left arm pain and swelling.  He is nonverbal with significant mental retardation.  Here today with group home care providers who provide history.  He was evaluated in the emergency room back on 14 October after a fall in the shower.  He was noted to have a midshaft humerus fracture.  Here today for operative management.  No new complaints.  Past Medical History:  Diagnosis Date   Anxiety    Autism    Constipation    Eczema    Hypothyroidism    Mental retardation    Myopia    Seizure (H[facility])    last seizure was in summer 2022 as of 04/29/21 per Victorino Dike @ [facility]   Seizure disorder [facility])    Shortness of breath    no current problem as of 04/29/21 per Victorino Dike at [facility]   Past Surgical History:  Procedure Laterality Date   DENTAL RESTORATION/EXTRACTION WITH X-RAY N/A 03/02/2013   Procedure: RECALL DENTAL EXAMINATION AND CLEANING; FULL MOUTH X-RAYS' FULL MOUTH DEBRIDEMENT; EXTRACTION OF TEETH #1, 16, 30; COMPOSITE FILLINGS OF TEETH #7, 8, 9, 10;  Surgeon: Esaw Dace., DDS;  Location: [facility] OR;  Service: Oral Surgery;  Laterality: N/A;   DENTAL RESTORATION/EXTRACTION WITH X-RAY N/A 04/30/2021   Procedure: DENTAL RESTORATION/EXTRACTION WITH X-RAY. #5, #17 surgical extractions. Fillings on 49F, 90F, 63F, 9DLF, 10DF, 11DF, 43F, 28DOB, 32 B;  Surgeon: Joanna Hews, DMD;  Location: [facility] OR;  Service: Dentistry;  Laterality: N/A;   Social History   Socioeconomic History   Marital status: Single    Spouse name: Not on file   Number of children: Not on file   Years of education: Not on file   Highest education level: Not on file  Occupational History   Not on file  Tobacco Use   Smoking status: Never   Smokeless tobacco: Never  Vaping Use   Vaping Use: Never used  Substance and  Sexual Activity   Alcohol use: No   Drug use: No   Sexual activity: Not Currently  Other Topics Concern   Not on file  Social History Narrative   Not on file   Social Determinants of Health   Financial Resource Strain: Not on file  Food Insecurity: Not on file  Transportation Needs: Not on file  Physical Activity: Not on file  Stress: Not on file  Social Connections: Not on file   History reviewed. No pertinent family history. Allergies  Allergen Reactions   Bee Venom Anaphylaxis   Prior to Admission medications   Medication Sig Start Date End Date Taking? Authorizing Provider  acetaminophen (TYLENOL) 325 MG tablet Take 650 mg by mouth every 4 (four) hours as needed for fever or mild pain.   Yes [provider]  acetaminophen (TYLENOL) 650 MG CR tablet Take 650 mg by mouth in the morning, at noon, and at bedtime.   Yes [provider]  bisacodyl 5 MG EC tablet Take 5 mg by mouth every Monday, Wednesday, and Friday. (2000)   Yes [provider]  CAPLYTA 42 MG capsule Take 42 mg by mouth at bedtime. 02/02/22  Yes [provider]  carbamazepine (TEGRETOL) 100 MG chewable tablet Chew 300 mg by mouth 2 (two) times daily. (0800 & 1600)  Yes [provider]  carbamazepine (TEGRETOL) 200 MG tablet Take 400 mg by mouth at bedtime. (2000)   Yes [provider]  Cholecalciferol (VITAMIN D3) 50 MCG (2000 UT) TABS Take 4,000 Units by mouth in the morning. (0800)   Yes [provider]  clonazePAM (KLONOPIN) 1 MG tablet Take 1 mg by mouth 4 (four) times daily. (0800, 1200, 1600 & 2000)   Yes [provider]  diphenhydrAMINE (BENADRYL) 25 MG tablet Take 25 mg by mouth at bedtime. (2000)   Yes [provider]  EPINEPHrine 0.3 mg/0.3 mL IJ SOAJ injection Inject 0.3 mg into the muscle as needed for anaphylaxis (allergic reaction to bee stings).   Yes [provider]  gabapentin (NEURONTIN) 800 MG tablet Take 1  tablet (800 mg total) by mouth 3 (three) times daily. 02/03/13  Yes [name], MD  haloperidol (HALDOL) 1 MG tablet Take 1 mg by mouth in the morning. (0800)   Yes [provider]  levothyroxine (SYNTHROID) 25 MCG tablet Take 25 mcg by mouth daily at 6 (six) AM.   Yes [provider]  linaclotide (LINZESS) 145 MCG CAPS capsule Take 145 mcg by mouth daily before breakfast. (0800)   Yes [provider]  LORazepam (ATIVAN) 1 MG tablet Take 1-2 mg by mouth See admin instructions. Take 1 tablet (1 mg) by mouth as needed for behaviors not controlled by BSP>10 minutes. May repeat for 2 but not to exceed 3 mg/24hrs.   Yes [provider]  niacin (VITAMIN B3) 500 MG ER tablet Take 500 mg by mouth daily with breakfast. 02/01/22  Yes [provider]  OLANZapine (ZYPREXA) 10 MG tablet Take 10 mg by mouth in the morning. (0800)   Yes [provider]  OLANZapine (ZYPREXA) 15 MG tablet Take 15 mg by mouth daily at 4 PM.   Yes [provider]  oral electrolytes (THERMOTABS) TABS tablet Take 2 tablets by mouth daily. (0800)   Yes [provider]  QUEtiapine (SEROQUEL) 100 MG tablet Take 150 mg by mouth at bedtime. 02/06/22  Yes [provider]  simvastatin (ZOCOR) 20 MG tablet Take 30 mg by mouth at bedtime. (2000)   Yes [provider]   No results found.  Positive ROS: All other systems have been reviewed and were otherwise negative with the exception of those mentioned in the HPI and as above.  Physical Exam: General: Alert, in distress with pain Cardiovascular: No pedal edema Respiratory: No cyanosis, no use of accessory musculature GI: No organomegaly, abdomen is soft and non-tender Skin: Multiple areas of abrasions and skin blistering on the forearm of the left hand dorsally. Neurologic: Sensation intact distally Psychiatric: Patient is not competent  lymphatic: No axillary or cervical  lymphadenopathy  MUSCULOSKELETAL: Multiple skin blisters noted on the dorsal aspect of the forearm and dorsum of the hand.  Up along the shoulder and humeral region no open wounds.  He has a Sarmiento brace on.  Spontaneously moving distal.  Unable to do a focal exam due to mental retardation.  Assessment: Closed left midshaft oblique fracture left humerus  Plan: Due to the inability of the patient to be braced and his significant deficits due to mental retardation he has a significant concern for this becoming an open fracture and furthermore going on to nonunion.  We were able to get him into a Sarmiento but he still moves quite a bit and that and is not able to comply with any restrictions.  Therefore  our recommendation was to move ahead with intramedullary nailing.    Emergency consent statement: We have discussed this plan with the patient's care team here in the hospital and previously in the office.  There is no documentation to support their statement of being his healthcare power of attorney.  Furthermore he has his brother listed as his healthcare power of attorney.  This is [employer].  We have called him multiple times unable to achieve any answer of the phone.  Therefore we are moving ahead with emergent consent.  He has a clear operative indication with risk for morbid complication due to this fracture and gross instability.  It is medically necessary to proceed with surgery.  We are moving ahead with emergent consent due to no person available to provide written and verbal consent on his behalf.  Risk of the procedure include bleeding, infection, damage to surrounding nerves and vessels, wound healing issues related to his noncompliance, nonunion, malunion, hardware failure or complication related to the device and his noncompliance.  Also risk of anesthesia.  Plan for discharge home postoperative from PACU.    Nicholes Stairs, MD Cell [phone]     02/18/2022 5:33 PM

## 2022-02-18 NOTE — Anesthesia Preprocedure Evaluation (Addendum)
Anesthesia Evaluation  Patient identified by MRN, date of birth, ID band Patient awake    Reviewed: Allergy & Precautions, H&P , NPO status , Patient's Chart, lab work & pertinent test results  Airway Mallampati: II  TM Distance: >3 FB Neck ROM: Full    Dental no notable dental hx.    Pulmonary neg pulmonary ROS   Pulmonary exam normal breath sounds clear to auscultation       Cardiovascular negative cardio ROS Normal cardiovascular exam Rhythm:Regular Rate:Normal     Neuro/Psych Seizures -, Well Controlled,  Mental retardation Autism Non-verbal  negative psych ROS   GI/Hepatic negative GI ROS, Neg liver ROS,,,  Endo/Other  negative endocrine ROS    Renal/GU negative Renal ROS  negative genitourinary   Musculoskeletal negative musculoskeletal ROS (+)    Abdominal   Peds negative pediatric ROS (+)  Hematology negative hematology ROS (+)   Anesthesia Other Findings   Reproductive/Obstetrics negative OB ROS                             Anesthesia Physical Anesthesia Plan  ASA: 3  Anesthesia Plan: General   Post-op Pain Management: Ketamine IV*   Induction: Intravenous  PONV Risk Score and Plan: 2 and Ondansetron, Dexamethasone and Treatment may vary due to age or medical condition  Airway Management Planned: Oral ETT  Additional Equipment:   Intra-op Plan:   Post-operative Plan: Extubation in OR  Informed Consent: I have reviewed the patients History and Physical, chart, labs and discussed the procedure including the risks, benefits and alternatives for the proposed anesthesia with the patient or authorized representative who has indicated his/her understanding and acceptance.     Dental advisory given  Plan Discussed with: CRNA and Surgeon  Anesthesia Plan Comments: (350mg  IM ketamine before going to OR)       Anesthesia Quick Evaluation

## 2022-02-18 NOTE — Progress Notes (Signed)
Attempted to reach Philip Vazquez via phone to confirm discharge from recovery room back to group home, no answer. Discharge instructions reviewed with [name], ride and caregiver from group home.

## 2022-02-18 NOTE — Anesthesia Postprocedure Evaluation (Signed)
Anesthesia Post Note  Patient: Philip Vazquez  Procedure(s) Performed: INTRAMEDULLARY (IM) NAIL HUMERUS (Left)     Patient location during evaluation: PACU Anesthesia Type: General Level of consciousness: awake and alert Pain management: pain level controlled Vital Signs Assessment: post-procedure vital signs reviewed and stable Respiratory status: spontaneous breathing, nonlabored ventilation, respiratory function stable and patient connected to nasal cannula oxygen Cardiovascular status: blood pressure returned to baseline and stable Postop Assessment: no apparent nausea or vomiting Anesthetic complications: no  No notable events documented.  Last Vitals:  Vitals:   02/18/22 2015 02/18/22 2030  BP: (!) 155/80 (!) 157/97  Pulse: 95 96  Resp: 14 17  Temp:  36.7 C  SpO2: 96% 90%    Last Pain: There were no vitals filed for this visit.               Tiajuana Amass

## 2022-02-22 ENCOUNTER — Encounter (HOSPITAL_COMMUNITY): Payer: Self-pay | Admitting: Orthopedic Surgery

## 2022-05-13 ENCOUNTER — Ambulatory Visit: Payer: Medicare Other | Admitting: Gastroenterology

## 2022-06-07 ENCOUNTER — Encounter: Payer: Self-pay | Admitting: Gastroenterology

## 2022-06-07 ENCOUNTER — Ambulatory Visit (INDEPENDENT_AMBULATORY_CARE_PROVIDER_SITE_OTHER): Payer: Medicare Other | Admitting: Gastroenterology

## 2022-06-07 VITALS — Ht 67.0 in | Wt 173.0 lb

## 2022-06-07 DIAGNOSIS — Z1211 Encounter for screening for malignant neoplasm of colon: Secondary | ICD-10-CM

## 2022-06-07 NOTE — Progress Notes (Signed)
Chief Complaint: Discuss options for colon cancer screening   Referring Provider:     Javier Docker, MD    HPI:     MUSSIE ADGER is a 52 y.o. male with a history of severe autism, seizure disorder (last seizure 04/2021), hypothyroidism, anxiety, referred to the [facility]astro[facility]ology [facility] for evaluation of colon cancer screening and to discuss screening options.  He presents with 2 of his caretakers who provides his entire history.  He is otherwise unable to provide any history due to very limited understanding.  No previous colon cancer screening.  His caretakers are unaware of any known family history of colon cancer or GI malignancy.  Otherwise, melena.  Has a preserved appetite and no complaints of abdominal pain or change in bowel habits.    No recent labs or abdominal imaging for review.   Past Medical History:  Diagnosis Date   Anxiety    Autism    Constipation    Eczema    Hypothyroidism    Mental retardation    Myopia    Seizure ([place])    last seizure was in summer 2022 as of 04/29/21 per Anderson Malta @ [facility]   Seizure disorder [facility])    Shortness of breath    no current problem as of 04/29/21 per Anderson Malta at [facility]     Past Surgical History:  Procedure Laterality Date   DENTAL RESTORATION/EXTRACTION WITH X-RAY N/A 03/02/2013   Procedure: RECALL DENTAL EXAMINATION AND CLEANING; FULL MOUTH X-RAYS' FULL MOUTH DEBRIDEMENT; EXTRACTION OF TEETH #1, 16, 30; COMPOSITE FILLINGS OF TEETH #7, 8, 9, 10;  Surgeon: Jamison Oka., DDS;  Location: [place];  Service: Oral Surgery;  Laterality: N/A;   DENTAL RESTORATION/EXTRACTION WITH X-RAY N/A 04/30/2021   Procedure: DENTAL RESTORATION/EXTRACTION WITH X-RAY. #5, #17 surgical extractions. Fillings on 86F, 37F, 40F, 9DLF, 10DF, 11DF, 22F, 28DOB, 32 B;  Surgeon: Janae Bridgeman, DMD;  Location: [place];  Service: Dentistry;  Laterality: N/A;   HUMERUS IM NAIL Left 02/18/2022   Procedure: INTRAMEDULLARY (IM) NAIL HUMERUS;  Surgeon:  Nicholes Stairs, MD;  Location: [place];  Service: Orthopedics;  Laterality: Left;  90 requesting add on room 5:30pm   SHOULDER ACROMIOPLASTY  12/2021   No family history on file. Social History   Tobacco Use   Smoking status: Never   Smokeless tobacco: Never  Vaping Use   Vaping Use: Never used  Substance Use Topics   Alcohol use: No   Drug use: No   Current Outpatient Medications  Medication Sig Dispense Refill   acetaminophen (TYLENOL) 325 MG tablet Take 650 mg by mouth every 4 (four) hours as needed for fever or mild pain.     acetaminophen (TYLENOL) 650 MG CR tablet Take 650 mg by mouth in the morning, at noon, and at bedtime.     bisacodyl 5 MG EC tablet Take 5 mg by mouth every Monday, Wednesday, and Friday. (2000)     CAPLYTA 42 MG capsule Take 42 mg by mouth at bedtime.     carbamazepine (TEGRETOL) 100 MG chewable tablet Chew 300 mg by mouth 2 (two) times daily. (0800 & 1600)     carbamazepine (TEGRETOL) 200 MG tablet Take 400 mg by mouth at bedtime. (2000)     Cholecalciferol (VITAMIN D3) 50 MCG (2000 UT) TABS Take 4,000 Units by mouth in the morning. (0800)     clonazePAM (KLONOPIN) 1 MG tablet Take  1 mg by mouth 4 (four) times daily. (0800, 1200, 1600 & 2000)     diphenhydrAMINE (BENADRYL) 25 MG tablet Take 25 mg by mouth at bedtime. (2000)     EPINEPHrine 0.3 mg/0.3 mL IJ SOAJ injection Inject 0.3 mg into the muscle as needed for anaphylaxis (allergic reaction to bee stings).     gabapentin (NEURONTIN) 800 MG tablet Take 1 tablet (800 mg total) by mouth 3 (three) times daily. 90 tablet 0   haloperidol (HALDOL) 1 MG tablet Take 1 mg by mouth in the morning. (0800)     levothyroxine (SYNTHROID) 25 MCG tablet Take 25 mcg by mouth daily at 6 (six) AM.     linaclotide (LINZESS) 145 MCG CAPS capsule Take 145 mcg by mouth daily before breakfast. (0800)     LORazepam (ATIVAN) 1 MG tablet Take 1-2 mg by mouth See admin instructions. Take 1 tablet (1 mg) by mouth as needed for  behaviors not controlled by BSP>10 minutes. May repeat for 2 but not to exceed 3 mg/24hrs.     niacin (VITAMIN B3) 500 MG ER tablet Take 500 mg by mouth daily with breakfast.     OLANZapine (ZYPREXA) 10 MG tablet Take 10 mg by mouth in the morning. (0800)     OLANZapine (ZYPREXA) 15 MG tablet Take 15 mg by mouth daily at 4 PM.     oral electrolytes (THERMOTABS) TABS tablet Take 2 tablets by mouth daily. (0800)     QUEtiapine (SEROQUEL) 100 MG tablet Take 150 mg by mouth at bedtime.     simvastatin (ZOCOR) 20 MG tablet Take 30 mg by mouth at bedtime. (2000)     No current facility-administered medications for this visit.   Allergies  Allergen Reactions   Bee Venom Anaphylaxis     Review of Systems: All systems reviewed and negative except where noted in HPI.     Physical Exam:    Wt Readings from Last 3 Encounters:  06/07/22 173 lb (78.5 kg)  02/18/22 160 lb 15 oz (73 kg)  01/30/22 160 lb 15 oz (73 kg)    Ht 5' 7"$  (1.702 m)   Wt 173 lb (78.5 kg)   BMI 27.10 kg/m  Constitutional:  Pleasant, in no acute distress.  Constantly pacing the exam room and is easily agitated. Psychiatric: Normal mood and affect. Behavior is normal. Skin: Skin is warm and dry. No rashes noted.   ASSESSMENT AND PLAN;   1) Colon cancer screening We discussed options for CRC screening today, to include optical colonoscopy, Cologuard.  I do not think he will be able to sit still for virtual colonoscopy.  His caretakers and I are concerned about his ability to tolerate the bowel preparation and undergo IV placement for colonoscopy given his severe mental disability and anxiety/agitation. Instead we will proceed with Cologuard first.  I did discuss the possibility of colonoscopy if the Cologuard is in fact positive.   - Ordered Cologuard today  2) Severe autism 3) Anxiety As above, had concerns about his ability to undergo IV placement and would also be difficult to tolerate bowel preparation.   Proceeding with Cologuard first.    Lavena Bullion, DO, [facility]  06/07/2022, 11:42 AM   [place], Ralene Bathe, MD

## 2022-06-07 NOTE — Patient Instructions (Signed)
Your provider has ordered Cologuard testing as an option for colon cancer screening. This is performed by [employer] and may be out of network with your insurance. PRIOR to completing the test, it is YOUR responsibility to contact your insurance about covered benefits for this test. Your out of pocket expense could be anywhere from $0.00 to $649.00.   When you call to check coverage with your insurer, please provide the following information:   -The ONLY provider of Cologuard is [place] code for Cologuard is [phone].  [employer] NPI # [id number]  -[employer] Tax ID # [id number]   We have already sent your demographic and insurance information to [employer] (phone number [phone]) and they should contact you within the next week regarding your test. If you have not heard from them within the next week, please call our office at [phone].    Due to recent changes in healthcare laws, you may see the results of your imaging and laboratory studies on MyChart before your provider has had a chance to review them.  We understand that in some cases there may be results that are confusing or concerning to you. Not all laboratory results come back in the same time frame and the provider may be waiting for multiple results in order to interpret others.  Please give [place] 48 hours in order for your provider to thoroughly review all the results before contacting the office for clarification of your results.    Thank you for choosing me and [facility].  Vito Cirigliano, D.O.

## 2023-02-25 ENCOUNTER — Encounter (HOSPITAL_COMMUNITY): Payer: Self-pay

## 2023-02-25 ENCOUNTER — Other Ambulatory Visit: Payer: Self-pay

## 2023-02-25 ENCOUNTER — Emergency Department (HOSPITAL_COMMUNITY)
Admission: EM | Admit: 2023-02-25 | Discharge: 2023-02-25 | Disposition: A | Discharge status: Home / Self Care | Payer: Medicare Other | Attending: Emergency Medicine | Admitting: Emergency Medicine

## 2023-02-25 ENCOUNTER — Emergency Department (HOSPITAL_COMMUNITY): Payer: Medicare Other

## 2023-02-25 DIAGNOSIS — X58XXXA Exposure to other specified factors, initial encounter: Secondary | ICD-10-CM | POA: Diagnosis not present

## 2023-02-25 DIAGNOSIS — R451 Restlessness and agitation: Secondary | ICD-10-CM | POA: Diagnosis not present

## 2023-02-25 DIAGNOSIS — S8001XA Contusion of right knee, initial encounter: Secondary | ICD-10-CM | POA: Insufficient documentation

## 2023-02-25 DIAGNOSIS — F840 Autistic disorder: Secondary | ICD-10-CM | POA: Insufficient documentation

## 2023-02-25 DIAGNOSIS — S8991XA Unspecified injury of right lower leg, initial encounter: Secondary | ICD-10-CM

## 2023-02-25 MED ORDER — IBUPROFEN 800 MG PO TABS
800.0000 mg | ORAL_TABLET | Freq: Once | ORAL | Status: AC
  Administered 2023-02-25: 800 mg via ORAL
  Filled 2023-02-25: qty 1

## 2023-02-25 NOTE — ED Notes (Addendum)
Upon assessment, pt is in safety restraints attached to BL wrists. Glynis Smiles, PA aware and recommends continuation. Orders placed to continue. Caregivers at bedside assisting with patient care.

## 2023-02-25 NOTE — Discharge Instructions (Signed)
Please have the patient remain in the knee immobilizer until seen by orthopedics.  Please call Dr. Everardo Pacific on Monday to schedule an appointment for him to be seen.  His numbers attached to this form.  Please continue giving the patient ibuprofen every 6 hours as needed for swelling.  You may also the patient apply ice to his right knee.  Return to the ED with any new symptoms such as fevers.
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
## 2023-02-25 NOTE — ED Notes (Signed)
Attempted to contact pt leagal guardian x3. Report given to pt caregiver.
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
## 2023-03-01 ENCOUNTER — Emergency Department (HOSPITAL_BASED_OUTPATIENT_CLINIC_OR_DEPARTMENT_OTHER)
Admission: EM | Admit: 2023-03-01 | Discharge: 2023-03-02 | Disposition: A | Discharge status: Home / Self Care | Payer: Medicare Other | Attending: Emergency Medicine | Admitting: Emergency Medicine

## 2023-03-01 ENCOUNTER — Emergency Department (HOSPITAL_BASED_OUTPATIENT_CLINIC_OR_DEPARTMENT_OTHER): Payer: Medicare Other

## 2023-03-01 ENCOUNTER — Encounter (HOSPITAL_BASED_OUTPATIENT_CLINIC_OR_DEPARTMENT_OTHER): Payer: Self-pay | Admitting: Emergency Medicine

## 2023-03-01 ENCOUNTER — Other Ambulatory Visit: Payer: Self-pay

## 2023-03-01 DIAGNOSIS — Z1152 Encounter for screening for COVID-19: Secondary | ICD-10-CM | POA: Insufficient documentation

## 2023-03-01 DIAGNOSIS — R609 Edema, unspecified: Secondary | ICD-10-CM

## 2023-03-01 DIAGNOSIS — F840 Autistic disorder: Secondary | ICD-10-CM | POA: Diagnosis not present

## 2023-03-01 DIAGNOSIS — R600 Localized edema: Secondary | ICD-10-CM

## 2023-03-01 DIAGNOSIS — R2241 Localized swelling, mass and lump, right lower limb: Secondary | ICD-10-CM | POA: Diagnosis present

## 2023-03-01 DIAGNOSIS — S76109A Unspecified injury of unspecified quadriceps muscle, fascia and tendon, initial encounter: Secondary | ICD-10-CM

## 2023-03-01 DIAGNOSIS — W19XXXA Unspecified fall, initial encounter: Secondary | ICD-10-CM | POA: Insufficient documentation

## 2023-03-01 DIAGNOSIS — Z79899 Other long term (current) drug therapy: Secondary | ICD-10-CM | POA: Insufficient documentation

## 2023-03-01 LAB — CBC WITH DIFFERENTIAL/PLATELET
Abs Immature Granulocytes: 0.03 10*3/uL (ref 0.00–0.07)
Basophils Absolute: 0 10*3/uL (ref 0.0–0.1)
Basophils Relative: 1 %
Eosinophils Absolute: 0.4 10*3/uL (ref 0.0–0.5)
Eosinophils Relative: 4 %
HCT: 30.6 % — ABNORMAL LOW (ref 39.0–52.0)
Hemoglobin: 10.3 g/dL — ABNORMAL LOW (ref 13.0–17.0)
Immature Granulocytes: 0 %
Lymphocytes Relative: 17 %
Lymphs Abs: 1.5 10*3/uL (ref 0.7–4.0)
MCH: 29.3 pg (ref 26.0–34.0)
MCHC: 33.7 g/dL (ref 30.0–36.0)
MCV: 87.2 fL (ref 80.0–100.0)
Monocytes Absolute: 1.2 10*3/uL — ABNORMAL HIGH (ref 0.1–1.0)
Monocytes Relative: 14 %
Neutro Abs: 5.7 10*3/uL (ref 1.7–7.7)
Neutrophils Relative %: 64 %
Platelets: 519 10*3/uL — ABNORMAL HIGH (ref 150–400)
RBC: 3.51 MIL/uL — ABNORMAL LOW (ref 4.22–5.81)
RDW: 13.9 % (ref 11.5–15.5)
WBC: 8.9 10*3/uL (ref 4.0–10.5)
nRBC: 0 % (ref 0.0–0.2)

## 2023-03-01 LAB — COMPREHENSIVE METABOLIC PANEL
ALT: 19 U/L (ref 0–44)
AST: 28 U/L (ref 15–41)
Albumin: 3.2 g/dL — ABNORMAL LOW (ref 3.5–5.0)
Alkaline Phosphatase: 246 U/L — ABNORMAL HIGH (ref 38–126)
Anion gap: 7 (ref 5–15)
BUN: 16 mg/dL (ref 6–20)
CO2: 28 mmol/L (ref 22–32)
Calcium: 7.7 mg/dL — ABNORMAL LOW (ref 8.9–10.3)
Chloride: 95 mmol/L — ABNORMAL LOW (ref 98–111)
Creatinine, Ser: 0.73 mg/dL (ref 0.61–1.24)
GFR, Estimated: >60 mL/min (ref >60)
Glucose, Bld: 92 mg/dL (ref 70–99)
Potassium: 3.8 mmol/L (ref 3.5–5.1)
Sodium: 130 mmol/L — ABNORMAL LOW (ref 135–145)
Total Bilirubin: 0.7 mg/dL (ref <1.2)
Total Protein: 5.9 g/dL — ABNORMAL LOW (ref 6.5–8.1)

## 2023-03-01 LAB — RESP PANEL BY RT-PCR (RSV, FLU A&B, COVID)  RVPGX2
Influenza A by PCR: NEGATIVE
Influenza B by PCR: NEGATIVE
Resp Syncytial Virus by PCR: NEGATIVE
SARS Coronavirus 2 by RT PCR: NEGATIVE

## 2023-03-01 LAB — CK: Total CK: 279 U/L (ref 49–397)

## 2023-03-01 MED ORDER — DIAZEPAM 5 MG/ML IJ SOLN
5.0000 mg | Freq: Once | INTRAMUSCULAR | Status: AC
  Administered 2023-03-01: 5 mg via INTRAMUSCULAR
  Filled 2023-03-01: qty 2

## 2023-03-01 MED ORDER — LORAZEPAM 2 MG/ML IJ SOLN
2.0000 mg | Freq: Once | INTRAMUSCULAR | Status: AC
Start: 2023-03-01 — End: 2023-03-01
  Administered 2023-03-01: 2 mg via INTRAMUSCULAR
  Filled 2023-03-01: qty 1

## 2023-03-01 MED ORDER — LORAZEPAM 2 MG/ML IJ SOLN
2.0000 mg | Freq: Once | INTRAMUSCULAR | Status: AC
  Administered 2023-03-01: 2 mg via INTRAVENOUS
  Filled 2023-03-01: qty 1

## 2023-03-01 MED ORDER — DIAZEPAM 5 MG/ML IJ SOLN: 5.0000 mg | Freq: Once | INTRAMUSCULAR | Status: DC

## 2023-03-01 MED ORDER — HALOPERIDOL LACTATE 5 MG/ML IJ SOLN
5.0000 mg | Freq: Once | INTRAMUSCULAR | Status: AC
  Administered 2023-03-01: 5 mg via INTRAVENOUS
  Filled 2023-03-01: qty 1

## 2023-03-01 MED ORDER — HALOPERIDOL LACTATE 5 MG/ML IJ SOLN
5.0000 mg | Freq: Once | INTRAMUSCULAR | Status: AC
Start: 2023-03-01 — End: 2023-03-01
  Administered 2023-03-01: 5 mg via INTRAMUSCULAR
  Filled 2023-03-01: qty 1

## 2023-03-01 MED ORDER — HALOPERIDOL LACTATE 5 MG/ML IJ SOLN
2.0000 mg | Freq: Once | INTRAMUSCULAR | Status: AC
  Administered 2023-03-01: 2 mg via INTRAVENOUS
  Filled 2023-03-01: qty 1

## 2023-03-01 MED ORDER — DIPHENHYDRAMINE HCL 50 MG/ML IJ SOLN
50.0000 mg | Freq: Once | INTRAMUSCULAR | Status: AC
  Administered 2023-03-01: 50 mg via INTRAMUSCULAR
  Filled 2023-03-01: qty 1

## 2023-03-01 MED ORDER — DIAZEPAM 5 MG/ML IJ SOLN
5.0000 mg | Freq: Once | INTRAMUSCULAR | Status: AC
  Administered 2023-03-01: 5 mg via INTRAVENOUS
  Filled 2023-03-01: qty 2

## 2023-03-01 MED ORDER — DIPHENHYDRAMINE HCL 50 MG/ML IJ SOLN: 50.0000 mg | Freq: Once | INTRAMUSCULAR | Status: DC

## 2023-03-01 MED ORDER — DIPHENHYDRAMINE HCL 50 MG/ML IJ SOLN
50.0000 mg | Freq: Once | INTRAMUSCULAR | Status: AC
  Administered 2023-03-01: 50 mg via INTRAVENOUS
  Filled 2023-03-01: qty 1
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
## 2023-03-01 NOTE — ED Notes (Signed)
US at bedside

## 2023-03-01 NOTE — ED Provider Notes (Addendum)
[place] EMERGENCY DEPARTMENT AT [facility] Provider Note   CSN: [id number] Arrival date & time: 03/01/23  1531     History  Chief Complaint  Patient presents with   Leg Swelling    Philip Vazquez is a 52 y.o. male past medical history of autism, mental retardation, seizure, anxiety, constipation, eczema, bone fracture, myopia, psychosis/mania today for a unilateral right leg swelling.  Patient was recently seen on 11/8 for injury of the right knee in the ED, with x-ray showing soft tissue swelling but no fracture, dysfunction or joint effusion.  Patient was sent home with knee immobilizer and referral to orthopedics.  Patient presents today for a unilateral right lower extremity swelling, initially starting with his knee about a week ago progressed to his whole leg yesterday.  There are 2 caretaker at the bedside, reports frequent falls.  They report that bending the right knee is painful to him, he screams.  There is bruising down his whole right leg.  He has not been wearing his brace that was sent to him by the ED.  Do associate symptom of fever last night, 100.2, 100.6, 100.8, 99.6; no cough, congestion, sore throat.  The caretakers at the bedside reports that the whole staff in the facility sick.  Otherwise no abdominal pain, nausea, vomiting, diarrhea.     Home Medications Prior to Admission medications   Medication Sig Start Date End Date Taking? Authorizing Provider  acetaminophen (TYLENOL) 325 MG tablet Take 650 mg by mouth every 4 (four) hours as needed for fever or mild pain.    [provider]  acetaminophen (TYLENOL) 650 MG CR tablet Take 650 mg by mouth in the morning, at noon, and at bedtime.    [provider]  bisacodyl 5 MG EC tablet Take 5 mg by mouth every Monday, Wednesday, and Friday. (2000)    [provider]  CAPLYTA 42 MG capsule Take 42 mg by mouth at bedtime. 02/02/22   [provider]  carbamazepine (TEGRETOL)  100 MG chewable tablet Chew 300 mg by mouth 2 (two) times daily. (0800 & 1600)    [provider]  carbamazepine (TEGRETOL) 200 MG tablet Take 400 mg by mouth at bedtime. (2000)    [provider]  Cholecalciferol (VITAMIN D3) 50 MCG (2000 UT) TABS Take 4,000 Units by mouth in the morning. (0800)    [provider]  clonazePAM (KLONOPIN) 1 MG tablet Take 1 mg by mouth 4 (four) times daily. (0800, 1200, 1600 & 2000)    [provider]  diphenhydrAMINE (BENADRYL) 25 MG tablet Take 25 mg by mouth at bedtime. (2000)    [provider]  EPINEPHrine 0.3 mg/0.3 mL IJ SOAJ injection Inject 0.3 mg into the muscle as needed for anaphylaxis (allergic reaction to bee stings).    [provider]  gabapentin (NEURONTIN) 800 MG tablet Take 1 tablet (800 mg total) by mouth 3 (three) times daily. 02/03/13   [name], MD  haloperidol (HALDOL) 1 MG tablet Take 1 mg by mouth in the morning. (0800)    [provider]  levothyroxine (SYNTHROID) 25 MCG tablet Take 25 mcg by mouth daily at 6 (six) AM.    [provider]  linaclotide (LINZESS) 145 MCG CAPS capsule Take 145 mcg by mouth daily before breakfast. (0800)    [provider]  LORazepam (ATIVAN) 1 MG tablet Take 1-2 mg by mouth See admin instructions. Take 1 tablet (1 mg) by mouth as needed  for behaviors not controlled by BSP>10 minutes. May repeat for 2 but not to exceed 3 mg/24hrs.    [provider]  niacin (VITAMIN B3) 500 MG ER tablet Take 500 mg by mouth daily with breakfast. 02/01/22   [provider]  OLANZapine (ZYPREXA) 10 MG tablet Take 10 mg by mouth in the morning. (0800)    [provider]  OLANZapine (ZYPREXA) 15 MG tablet Take 15 mg by mouth daily at 4 PM.    [provider]  oral electrolytes (THERMOTABS) TABS tablet Take 2 tablets by mouth daily. (0800)    [provider]  QUEtiapine (SEROQUEL) 100 MG tablet Take 150 mg  by mouth at bedtime. 02/06/22   [provider]  simvastatin (ZOCOR) 20 MG tablet Take 30 mg by mouth at bedtime. (2000)    [provider]      Allergies    Bee venom    Review of Systems   Review of Systems As per HPI  Physical Exam Updated Vital Signs BP (!) 141/81 (BP Location: Right Arm)   Pulse 80   Temp 97.9 F (36.6 C) (Oral)   Resp 16   Wt 76.2 kg   SpO2 99%   BMI 26.31 kg/m  Physical Exam  Constitutional:  in no acute distress, with two caretaker at bedside HENT: normocephalic atraumatic, mucous membranes moist Cardiovascular: regular rate and rhythm, no m/r/g Pulmonary/Chest: normal work of breathing on room air, lungs clear to auscultation bilaterally Abdominal: soft, non-tender, non-distended, bowel sounds present MSK: + R unilateral swelling LE, non- pitting. + scattered bruising along the right leg, no TTP along the calf or posterior knee.  Skin: warm and dry Psych: Agitated, aggressive, combative  ED Results / Procedures / Treatments   Labs (all labs ordered are listed, but only abnormal results are displayed) Labs Reviewed  CBC WITH DIFFERENTIAL/PLATELET - Abnormal; Notable for the following components:      Result Value   RBC 3.51 (*)    Hemoglobin 10.3 (*)    HCT 30.6 (*)    Platelets 519 (*)    Monocytes Absolute 1.2 (*)    All other components within normal limits  COMPREHENSIVE METABOLIC PANEL - Abnormal; Notable for the following components:   Sodium 130 (*)    Chloride 95 (*)    Calcium 7.7 (*)    Total Protein 5.9 (*)    Albumin 3.2 (*)    Alkaline Phosphatase 246 (*)    All other components within normal limits  RESP PANEL BY RT-PCR (RSV, FLU A&B, COVID)  RVPGX2  CK    EKG None  Radiology US Venous Img Lower Unilateral Right (DVT)  Result Date: 03/01/2023 CLINICAL DATA:  Fall injury 02/25/2023, at the time right knee x-rays negative for fracture with generalized soft tissue edema and suprapatellar bursal  effusion. EXAM: RIGHT LOWER EXTREMITY VENOUS DOPPLER ULTRASOUND TECHNIQUE: Gray-scale sonography with compression, as well as color and duplex ultrasound, were performed to evaluate the deep venous system(s) from the level of the common femoral vein through the popliteal and proximal calf veins. COMPARISON:  None Available. FINDINGS: VENOUS Normal compressibility of the common femoral, superficial femoral, and popliteal veins, as well as the visualized calf veins, with the calf veins not optimally studied due to edema and patient discomfort. Visualized portions of profunda femoral vein and great saphenous vein unremarkable. No filling defects to suggest DVT on grayscale or color Doppler imaging. Doppler waveforms show normal direction of venous flow, normal respiratory plasticity  and response to augmentation. Limited views of the contralateral common femoral vein are unremarkable. OTHER There are nonspecific right inguinal chain lymph nodes, largest short axis diameter is 1 cm. Technologist provided additional images labeled "right anterior thigh" which demonstrate an intramuscular tear with a linear fluid collection 14 cm length, 0.8 cm AP and 4.4 cm transverse, possibly in the rectus femoris or in the anterior aspect of the vastus medialis muscle, but unsure based on the images alone. There is generalized subcutaneous edema as well. Incidental note is also made of uncomplicated anechoic popliteal cyst measuring 2.3 x 0.8 x 1 cm. Limitations: Patient motion and discomfort during the exam. Sedatives had been previously administered but did not help. IMPRESSION: 1. No evidence of right lower extremity DVT. 2. Nonspecific right inguinal chain lymph nodes. 3. Further images labeled "right anterior thigh" which demonstrate an intramuscular tear with a linear fluid collection 14 cm length, 0.8 cm AP and 4.4 cm transverse, possibly in the rectus femoris or in the anterior aspect of the vastus medialis muscle, but unsure  based on the images alone. CT may be helpful to further localize the abnormality if clinically warranted. 4. Generalized subcutaneous edema. 5. Small popliteal cyst. Electronically Signed   By: Almira Bar M.D.   On: 03/01/2023 22:21    Procedures Procedures  None  Medications Ordered in ED Medications  haloperidol lactate (HALDOL) injection 5 mg (5 mg Intramuscular Given 03/01/23 1641)  LORazepam (ATIVAN) injection 2 mg (2 mg Intramuscular Given 03/01/23 1639)  diazepam (VALIUM) injection 5 mg (5 mg Intramuscular Given 03/01/23 1810)  diphenhydrAMINE (BENADRYL) injection 50 mg (50 mg Intramuscular Given 03/01/23 1810)  LORazepam (ATIVAN) injection 2 mg (2 mg Intravenous Given 03/01/23 2147)  haloperidol lactate (HALDOL) injection 2 mg (2 mg Intravenous Given 03/01/23 2147)    ED Course/ Medical Decision Making/ A&P  Medical Decision Making Amount and/or Complexity of Data Reviewed Labs: ordered. Radiology: ordered.  Risk Prescription drug management.   This patient presents to the ED for concern of unilateral leg swelling, this involves an extensive number of treatment options, and is a complaint that carries with it a high risk of complications and morbidity.  The differential diagnosis includes DVT, lymphangitis, venous insufficiency, superficial thrombophlebitis, cellulitis    Co morbidities that complicate the patient evaluation  As per HPI    Additional history obtained:  Additional history obtained from Care takers at the bedside  External records from outside source obtained and reviewed including none   Lab Tests:  I Ordered, and personally interpreted labs.  The pertinent results include: No leukocytosis, hemoglobin 10.3, platelets 519, sodium 130, calcium 7.7, total protein 5.9, albumin 3.2, alk phos 246, CK 279, respiratory panel negative COVID, flu A, B RSV.   Imaging Studies ordered:  I ordered imaging studies including Venous doppler US, CT Tibia  fibula/Femur Right w contrast I independently visualized and interpreted the venous Doppler ultrasound of the right lower extremity that showed multiple areas of fluid collections and likely a Bakers cyst at the posterior popliteal region. I agree with the radiologist finding as below: 1. No evidence of right lower extremity DVT. 2. Nonspecific right inguinal chain lymph nodes. 3. Further images labeled "right anterior thigh" which demonstrate an intramuscular tear with a linear fluid collection 14 cm length, 0.8 cm AP and 4.4 cm transverse, possibly in the rectus femoris or in the anterior aspect of the vastus medialis muscle, but unsure based on the images alone. CT may be helpful to further  localize the abnormality if clinically warranted. 4. Generalized subcutaneous edema. 5. Small popliteal cyst.   Cardiac Monitoring: / EKG:  None   Consultations Obtained: None   Problem List / ED Course / Critical interventions / Medication management  Unilateral right lower extremity swelling I ordered medication including Valium 5 mg, Benadryl 50 mg, Haldol 2 mg, Haldol 5 mg, Ativan 2 mg x 2 for agitation and aggressive behavior Reevaluation of the patient after these medicines showed that the patient stayed the same I have reviewed the patients home medicines and have made adjustments as needed  Social Determinants of Health:  As above   Test / Admission - Considered:  This is a 52 year old male with past medical history of autism, mental retardation, seizure, anxiety, psychosis/mania who presents today for unilateral right leg swelling.  Patient is combative, agitated, aggressive in the ED.  Patient is a poor historian, he is not able to articulate words, he screams.  Unable to obtain proper history from the patient, however there are 2 caretakers at the bedside who reported history.  Patient was recently seen in the ED on 11/8 for injury of the right knee where they did x-ray of the right  knee that showed soft tissue swelling, and he was discharged with a knee immobilizer however caretakers note that he has not been wearing the knee immobilizer.  Per the physical exam, he has unilateral right lower extremity swelling with scattered bruising along the right leg, no tenderness to palpation along the calf or posterior knee. No active signs of infection noted, low suspicion for cellulitis or septic arthritis. He is not on vasodilators and CCB. Wells' criteria for DVT calculated at 2 with moderate risk. Caretakers at the bedside also note fevers last night due to temperature of 100.2, 100.6, 100.8 and multiple sick staff at the facility.  Patient is combative, aggressive, agitated in the ED here; patient is given multiple rounds of Haldol and Ativan, with Valium 5 mg to get labs and imaging.  My amputation of the venous Doppler ultrasound of the right lower extremity showed multiple areas of fluid collections and Baker's cyst at the posterior popliteal region, official reads are pending.  We ordered CT tib-fib and femur with contrast to further evaluate the findings of the right lower extremity ultrasound.  Otherwise labs are reassuring with no leukocytosis.  Plan to sedate the patient prior to getting CT femur and tib/fib; patient is given another dose of Valium 5 mg and Benadryl 50 mg. Will continue to observe. Patient is signed off to the night team.   Final Clinical Impression(s) / ED Diagnoses Final diagnoses:  Leg edema    Rx / DC Orders ED Discharge Orders     None         Jeral Pinch, DO 03/01/23 2227    Jeral Pinch, DO 03/01/23 2235    Jeral Pinch, DO 03/01/23 2326    Charlynne Pander, MD 03/01/23 2332

## 2023-03-01 NOTE — ED Triage Notes (Signed)
Pt returns for RLE swelling; pt lives is a group home; staff is unsure if he had a fall, but pt falls frequently

## 2023-03-02 NOTE — ED Provider Notes (Signed)
Care assumed at shift change. Here for leg pain, recent negative xrays, [place] here concerning for muscle injury. Physical Exam  BP (!) 141/81 (BP Location: Right Arm)   Pulse 80   Temp 97.9 F (36.6 C) (Oral)   Resp 16   Wt 76.2 kg   SpO2 99%   BMI 26.31 kg/m   Physical Exam  Procedures  Procedures  ED Course / MDM   Clinical Course as of 03/02/23 0013  Wed Mar 02, 2023  0010 Patient remains restless, unable to sit still in bed despite numerous medications. He is ambulatory in no distress. Discussed with caregiver and nurse from group home, offered continued efforts at sedation here vs outpatient Ortho follow up and consideration of anesthesia sedation for advanced imaging. They would prefer to take him back home now, will give a referral to Ortho. RTED for any other concerns.  [CS]    Clinical Course User Index [CS] Pollyann Savoy, MD   Medical Decision Making Amount and/or Complexity of Data Reviewed Labs: ordered. Radiology: ordered.  Risk Prescription drug management.          Pollyann Savoy, MD 03/02/23 [phone]

## 2023-03-10 NOTE — Progress Notes (Signed)
Patient had COVID exposure

## 2023-07-17 ENCOUNTER — Emergency Department (HOSPITAL_COMMUNITY)

## 2023-07-17 ENCOUNTER — Encounter (HOSPITAL_COMMUNITY): Payer: Self-pay | Admitting: Emergency Medicine

## 2023-07-17 ENCOUNTER — Emergency Department (HOSPITAL_COMMUNITY)
Admission: EM | Admit: 2023-07-17 | Discharge: 2023-07-17 | Disposition: A | Discharge status: Home / Self Care | Attending: Emergency Medicine | Admitting: Emergency Medicine

## 2023-07-17 ENCOUNTER — Other Ambulatory Visit: Payer: Self-pay

## 2023-07-17 DIAGNOSIS — Y92199 Unspecified place in other specified residential institution as the place of occurrence of the external cause: Secondary | ICD-10-CM | POA: Diagnosis not present

## 2023-07-17 DIAGNOSIS — W19XXXA Unspecified fall, initial encounter: Secondary | ICD-10-CM | POA: Insufficient documentation

## 2023-07-17 DIAGNOSIS — Z23 Encounter for immunization: Secondary | ICD-10-CM | POA: Diagnosis not present

## 2023-07-17 DIAGNOSIS — S0101XA Laceration without foreign body of scalp, initial encounter: Secondary | ICD-10-CM | POA: Insufficient documentation

## 2023-07-17 DIAGNOSIS — S0990XA Unspecified injury of head, initial encounter: Secondary | ICD-10-CM | POA: Diagnosis present

## 2023-07-17 MED ORDER — LIDOCAINE-EPINEPHRINE (PF) 2 %-1:200000 IJ SOLN
20.0000 mL | Freq: Once | INTRAMUSCULAR | Status: AC
  Administered 2023-07-17: 20 mL via INTRADERMAL
  Filled 2023-07-17: qty 20

## 2023-07-17 MED ORDER — TETANUS-DIPHTH-ACELL PERTUSSIS 5-2.5-18.5 LF-MCG/0.5 IM SUSY
0.5000 mL | PREFILLED_SYRINGE | Freq: Once | INTRAMUSCULAR | Status: AC
  Administered 2023-07-17: 0.5 mL via INTRAMUSCULAR
  Filled 2023-07-17: qty 0.5

## 2023-07-17 MED ORDER — ZIPRASIDONE MESYLATE 20 MG IM SOLR
20.0000 mg | Freq: Once | INTRAMUSCULAR | Status: AC
Start: 2023-07-17 — End: 2023-07-17
  Administered 2023-07-17: 20 mg via INTRAMUSCULAR
  Filled 2023-07-17: qty 20

## 2023-07-17 MED ORDER — DIPHENHYDRAMINE HCL 50 MG/ML IJ SOLN
25.0000 mg | Freq: Once | INTRAMUSCULAR | Status: AC
Start: 2023-07-17 — End: 2023-07-17
  Administered 2023-07-17: 25 mg via INTRAMUSCULAR
  Filled 2023-07-17: qty 1

## 2023-07-17 MED ORDER — STERILE WATER FOR INJECTION IJ SOLN
INTRAMUSCULAR | Status: AC
  Filled 2023-07-17: qty 10

## 2023-07-17 MED ORDER — LORAZEPAM 2 MG/ML IJ SOLN
2.0000 mg | Freq: Once | INTRAMUSCULAR | Status: AC
  Administered 2023-07-17: 2 mg via INTRAMUSCULAR
  Filled 2023-07-17: qty 1

## 2023-07-17 MED ORDER — LORAZEPAM 2 MG/ML IJ SOLN
2.0000 mg | Freq: Once | INTRAMUSCULAR | Status: AC
Start: 2023-07-17 — End: 2023-07-17
  Administered 2023-07-17: 2 mg via INTRAMUSCULAR
  Filled 2023-07-17: qty 1
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
## 2023-07-17 NOTE — ED Provider Notes (Signed)
 [place] EMERGENCY DEPARTMENT AT [facility] Provider Note   CSN: [id number] Arrival date & time: 07/17/23  0601     History  Chief Complaint  Patient presents with   Head Laceration   Fall    Philip Vazquez is a 53 y.o. male.  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  Patient was not in the room at the facility when this happened but heard him fall.  When he went to the room, patient was sitting upright.  He has sustained a laceration to his head.  He has not had his daily medications yet.  He was agitated and combative when he first arrived to ED.  This is his baseline.  Was given medications here are limited relief.  [facility] staff and was were at bedside, patient is at his baseline at this time       Home Medications Prior to Admission medications   Medication Sig Start Date End Date Taking? Authorizing Provider  acetaminophen (TYLENOL) 325 MG tablet Take 650 mg by mouth every 4 (four) hours as needed for fever or mild pain.    [provider]  acetaminophen (TYLENOL) 650 MG CR tablet Take 650 mg by mouth in the morning, at noon, and at bedtime.    [provider]  bisacodyl 5 MG EC tablet Take 5 mg by mouth every Monday, Wednesday, and Friday. (2000)    [provider]  CAPLYTA 42 MG capsule Take 42 mg by mouth at bedtime. 02/02/22   [provider]  carbamazepine (TEGRETOL) 100 MG chewable tablet Chew 300 mg by mouth 2 (two) times daily. (0800 & 1600)    [provider]  carbamazepine (TEGRETOL) 200 MG tablet Take 400 mg by mouth at bedtime. (2000)    [provider]  Cholecalciferol (VITAMIN D3) 50 MCG (2000 UT) TABS Take 4,000 Units by mouth in the morning. (0800)    [provider]  clonazePAM (KLONOPIN) 1 MG tablet Take 1 mg by mouth 4 (four) times daily. (0800, 1200, 1600 & 2000)    [provider]  diphenhydrAMINE  (BENADRYL) 25 MG tablet Take 25 mg by mouth at bedtime. (2000)    [provider]  EPINEPHrine 0.3 mg/0.3 mL IJ SOAJ injection Inject 0.3 mg into the muscle as needed for anaphylaxis (allergic reaction to bee stings).    [provider]  gabapentin (NEURONTIN) 800 MG tablet Take 1 tablet (800 mg total) by mouth 3 (three) times daily. 02/03/13   [name], MD  haloperidol (HALDOL) 1 MG tablet Take 1 mg by mouth in the morning. (0800)    [provider]  levothyroxine (SYNTHROID) 25 MCG tablet Take 25 mcg by mouth daily at 6 (six) AM.    [provider]  linaclotide (LINZESS) 145 MCG CAPS capsule Take 145 mcg by mouth daily before breakfast. (0800)    [provider]  LORazepam (ATIVAN) 1 MG tablet Take 1-2 mg by mouth See admin instructions. Take 1 tablet (1 mg) by mouth as needed for behaviors not controlled by BSP>10 minutes. May repeat for 2 but not to exceed 3 mg/24hrs.    [provider]  niacin (VITAMIN B3) 500 MG ER tablet Take 500 mg by mouth daily with breakfast. 02/01/22   [provider]  OLANZapine (ZYPREXA) 10 MG tablet Take 10 mg by mouth in the morning. (0800)    [provider]  OLANZapine (ZYPREXA)  15 MG tablet Take 15 mg by mouth daily at 4 PM.    [provider]  oral electrolytes (THERMOTABS) TABS tablet Take 2 tablets by mouth daily. (0800)    [provider]  QUEtiapine (SEROQUEL) 100 MG tablet Take 150 mg by mouth at bedtime. 02/06/22   [provider]  simvastatin (ZOCOR) 20 MG tablet Take 30 mg by mouth at bedtime. (2000)    [provider]      Allergies    Bee venom    Review of Systems   Review of Systems  All other systems reviewed and are negative.   Physical Exam Updated Vital Signs There were no vitals taken for this visit. Physical Exam Vitals and nursing note reviewed.  Constitutional:      General: He is not in acute distress.    Appearance:  Normal appearance. He is well-developed. He is not toxic-appearing.  HENT:     Head:   Eyes:     General: Lids are normal.     Conjunctiva/sclera: Conjunctivae normal.     Pupils: Pupils are equal, round, and reactive to light.  Neck:     Thyroid: No thyroid mass.     Trachea: No tracheal deviation.  Cardiovascular:     Rate and Rhythm: Normal rate and regular rhythm.     Heart sounds: Normal heart sounds. No murmur heard.    No gallop.  Pulmonary:     Effort: Pulmonary effort is normal. No respiratory distress.     Breath sounds: Normal breath sounds. No stridor. No decreased breath sounds, wheezing, rhonchi or rales.  Abdominal:     General: There is no distension.     Palpations: Abdomen is soft.     Tenderness: There is no abdominal tenderness. There is no rebound.  Musculoskeletal:        General: No tenderness. Normal range of motion.     Cervical back: Normal range of motion and neck supple.  Skin:    General: Skin is warm and dry.     Findings: No abrasion or rash.  Neurological:     Mental Status: He is alert. Mental status is at baseline.     GCS: GCS eye subscore is 4. GCS verbal subscore is 4. GCS motor subscore is 6.     Cranial Nerves: No cranial nerve deficit.     Sensory: No sensory deficit.     Motor: Motor function is intact.     Comments: Patient uncooperative with exam however he moves all 4 extremities appropriately.  No gross focal deficits appreciated  Psychiatric:        Attention and Perception: Attention normal.        Speech: Speech normal.        Behavior: Behavior normal.     ED Results / Procedures / Treatments   Labs (all labs ordered are listed, but only abnormal results are displayed) Labs Reviewed - No data to display  EKG None  Radiology No results found.  Procedures Procedures    Medications Ordered in ED Medications  ziprasidone (GEODON) injection 20 mg (20 mg Intramuscular Given 07/17/23 0622)  lidocaine-EPINEPHrine  (XYLOCAINE W/EPI) 2 %-1:200000 (PF) injection 20 mL (20 mLs Intradermal Given by Other 07/17/23 0627)  Tdap (BOOSTRIX) injection 0.5 mL (0.5 mLs Intramuscular Given 07/17/23 0626)  sterile water (preservative free) injection (  Given 07/17/23 0622)  LORazepam (ATIVAN) injection 2 mg (2 mg Intramuscular Given 07/17/23 0733)  diphenhydrAMINE (BENADRYL) injection 25 mg (25  mg Intramuscular Given 07/17/23 0733)  LORazepam (ATIVAN) injection 2 mg (2 mg Intramuscular Given 07/17/23 4098)    ED Course/ Medical Decision Making/ A&P                                 Medical Decision Making Amount and/or Complexity of Data Reviewed Radiology: ordered.  Risk Prescription drug management.    LACERATION REPAIR Performed by: Toy Baker Authorized by: Toy Baker Consent: Verbal consent obtained. Risks and benefits: risks, benefits and alternatives were discussed Consent given by: patient Patient identity confirmed: provided demographic data Prepped and Draped in normal sterile fashion Wound explored  Laceration Location: Scalp  Laceration Length: 2 cm  No Foreign Bodies seen or palpated  Anesthesia: local infiltration    Irrigation method: syringe Amount of cleaning: standard  Skin closure: Staples  Number of staples 4  Technique: Simple  Patient tolerance: Patient tolerated the procedure well with no immediate complications.  Patient medicated x 2 here.  Laceration repaired.  CT scan without acute findings.  Will discharge patient with group home staff members        Final Clinical Impression(s) / ED Diagnoses Final diagnoses:  None    Rx / DC Orders ED Discharge Orders     None         Lorre Nick, MD 07/17/23 [phone]
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
## 2023-07-17 NOTE — ED Triage Notes (Signed)
 Pt BIBA from group home for laceration on back of head that resulted from fall. No blood thinners. Unable to get vitals d/t pt agitation, Hx of autism/mental disability. Caregivers at bedside.

## 2023-07-17 NOTE — Discharge Instructions (Signed)
[facility] out in 10-14 days
